# Patient Record
Sex: Male | Born: 1980 | Hispanic: Yes | Marital: Single | Smoking: Never smoker
Health system: Southern US, Community
[De-identification: ages and names within clinical notes are randomized; demographics above are authoritative.]

## PROBLEM LIST (undated history)

## (undated) DIAGNOSIS — R569 Unspecified convulsions: Secondary | ICD-10-CM

## (undated) HISTORY — PX: OTHER SURGICAL HISTORY: SHX169

---

## 2018-12-18 ENCOUNTER — Other Ambulatory Visit: Payer: Self-pay

## 2018-12-18 ENCOUNTER — Inpatient Hospital Stay (HOSPITAL_COMMUNITY)
Admission: EM | Admit: 2018-12-18 | Discharge: 2018-12-29 | DRG: 897 | Disposition: A | Discharge status: Home / Self Care | Payer: Self-pay | Attending: Internal Medicine | Admitting: Internal Medicine

## 2018-12-18 ENCOUNTER — Encounter (HOSPITAL_COMMUNITY): Payer: Self-pay | Admitting: *Deleted

## 2018-12-18 ENCOUNTER — Emergency Department (HOSPITAL_COMMUNITY): Payer: Self-pay

## 2018-12-18 ENCOUNTER — Inpatient Hospital Stay (HOSPITAL_COMMUNITY): Payer: Self-pay

## 2018-12-18 DIAGNOSIS — G4700 Insomnia, unspecified: Secondary | ICD-10-CM | POA: Diagnosis present

## 2018-12-18 DIAGNOSIS — G4089 Other seizures: Secondary | ICD-10-CM | POA: Diagnosis present

## 2018-12-18 DIAGNOSIS — R569 Unspecified convulsions: Secondary | ICD-10-CM

## 2018-12-18 DIAGNOSIS — X58XXXA Exposure to other specified factors, initial encounter: Secondary | ICD-10-CM | POA: Diagnosis present

## 2018-12-18 DIAGNOSIS — F10931 Alcohol use, unspecified with withdrawal delirium: Secondary | ICD-10-CM | POA: Diagnosis present

## 2018-12-18 DIAGNOSIS — Z1159 Encounter for screening for other viral diseases: Secondary | ICD-10-CM

## 2018-12-18 DIAGNOSIS — K0889 Other specified disorders of teeth and supporting structures: Secondary | ICD-10-CM

## 2018-12-18 DIAGNOSIS — D6959 Other secondary thrombocytopenia: Secondary | ICD-10-CM | POA: Diagnosis present

## 2018-12-18 DIAGNOSIS — G9340 Encephalopathy, unspecified: Secondary | ICD-10-CM | POA: Diagnosis present

## 2018-12-18 DIAGNOSIS — R740 Nonspecific elevation of levels of transaminase and lactic acid dehydrogenase [LDH]: Secondary | ICD-10-CM

## 2018-12-18 DIAGNOSIS — D696 Thrombocytopenia, unspecified: Secondary | ICD-10-CM | POA: Diagnosis present

## 2018-12-18 DIAGNOSIS — F10231 Alcohol dependence with withdrawal delirium: Principal | ICD-10-CM | POA: Diagnosis present

## 2018-12-18 DIAGNOSIS — E876 Hypokalemia: Secondary | ICD-10-CM | POA: Diagnosis present

## 2018-12-18 DIAGNOSIS — F1020 Alcohol dependence, uncomplicated: Secondary | ICD-10-CM

## 2018-12-18 DIAGNOSIS — K7010 Alcoholic hepatitis without ascites: Secondary | ICD-10-CM | POA: Diagnosis present

## 2018-12-18 DIAGNOSIS — S01512A Laceration without foreign body of oral cavity, initial encounter: Secondary | ICD-10-CM | POA: Diagnosis present

## 2018-12-18 DIAGNOSIS — K7030 Alcoholic cirrhosis of liver without ascites: Secondary | ICD-10-CM | POA: Diagnosis present

## 2018-12-18 DIAGNOSIS — E871 Hypo-osmolality and hyponatremia: Secondary | ICD-10-CM | POA: Diagnosis present

## 2018-12-18 DIAGNOSIS — F419 Anxiety disorder, unspecified: Secondary | ICD-10-CM | POA: Diagnosis present

## 2018-12-18 DIAGNOSIS — Z87898 Personal history of other specified conditions: Secondary | ICD-10-CM

## 2018-12-18 DIAGNOSIS — Z781 Physical restraint status: Secondary | ICD-10-CM

## 2018-12-18 DIAGNOSIS — F209 Schizophrenia, unspecified: Secondary | ICD-10-CM | POA: Diagnosis present

## 2018-12-18 HISTORY — DX: Unspecified convulsions: R56.9

## 2018-12-18 LAB — CBC WITH DIFFERENTIAL/PLATELET
Abs Immature Granulocytes: 0.08 10*3/uL — ABNORMAL HIGH (ref 0.00–0.07)
Basophils Absolute: 0.1 10*3/uL (ref 0.0–0.1)
Basophils Relative: 1 %
Eosinophils Absolute: 0.2 10*3/uL (ref 0.0–0.5)
Eosinophils Relative: 2 %
HCT: 34.4 % — ABNORMAL LOW (ref 39.0–52.0)
Hemoglobin: 12 g/dL — ABNORMAL LOW (ref 13.0–17.0)
Immature Granulocytes: 1 %
Lymphocytes Relative: 10 %
Lymphs Abs: 1 10*3/uL (ref 0.7–4.0)
MCH: 33.8 pg (ref 26.0–34.0)
MCHC: 34.9 g/dL (ref 30.0–36.0)
MCV: 96.9 fL (ref 80.0–100.0)
Monocytes Absolute: 0.8 10*3/uL (ref 0.1–1.0)
Monocytes Relative: 8 %
Neutro Abs: 7.7 10*3/uL (ref 1.7–7.7)
Neutrophils Relative %: 78 %
Platelets: 29 10*3/uL — CL (ref 150–400)
RBC: 3.55 MIL/uL — ABNORMAL LOW (ref 4.22–5.81)
RDW: 14.7 % (ref 11.5–15.5)
WBC: 9.9 10*3/uL (ref 4.0–10.5)
nRBC: 0 % (ref 0.0–0.2)

## 2018-12-18 LAB — MAGNESIUM: Magnesium: 1.6 mg/dL — ABNORMAL LOW (ref 1.7–2.4)

## 2018-12-18 LAB — COMPREHENSIVE METABOLIC PANEL
ALT: 46 U/L — ABNORMAL HIGH (ref 0–44)
AST: 176 U/L — ABNORMAL HIGH (ref 15–41)
Albumin: 3.1 g/dL — ABNORMAL LOW (ref 3.5–5.0)
Alkaline Phosphatase: 159 U/L — ABNORMAL HIGH (ref 38–126)
Anion gap: 15 (ref 5–15)
BUN: <5 mg/dL — ABNORMAL LOW (ref 6–20)
CO2: 22 mmol/L (ref 22–32)
Calcium: 8.6 mg/dL — ABNORMAL LOW (ref 8.9–10.3)
Chloride: 94 mmol/L — ABNORMAL LOW (ref 98–111)
Creatinine, Ser: 0.58 mg/dL — ABNORMAL LOW (ref 0.61–1.24)
GFR calc Af Amer: >60 mL/min (ref >60)
GFR calc non Af Amer: >60 mL/min (ref >60)
Glucose, Bld: 142 mg/dL — ABNORMAL HIGH (ref 70–99)
Potassium: 4.9 mmol/L (ref 3.5–5.1)
Sodium: 131 mmol/L — ABNORMAL LOW (ref 135–145)
Total Bilirubin: 3.1 mg/dL — ABNORMAL HIGH (ref 0.3–1.2)
Total Protein: 7.8 g/dL (ref 6.5–8.1)

## 2018-12-18 LAB — URINALYSIS, ROUTINE W REFLEX MICROSCOPIC
Bilirubin Urine: NEGATIVE
Glucose, UA: NEGATIVE mg/dL
Hgb urine dipstick: NEGATIVE
Ketones, ur: NEGATIVE mg/dL
Leukocytes,Ua: NEGATIVE
Nitrite: NEGATIVE
Protein, ur: NEGATIVE mg/dL
Specific Gravity, Urine: 1.009 (ref 1.005–1.030)
pH: 8 (ref 5.0–8.0)

## 2018-12-18 LAB — RAPID URINE DRUG SCREEN, HOSP PERFORMED
Amphetamines: NOT DETECTED
Barbiturates: NOT DETECTED
Benzodiazepines: NOT DETECTED
Cocaine: NOT DETECTED
Opiates: NOT DETECTED
Tetrahydrocannabinol: NOT DETECTED

## 2018-12-18 LAB — PROTIME-INR
INR: 1.3 — ABNORMAL HIGH (ref 0.8–1.2)
Prothrombin Time: 15.7 seconds — ABNORMAL HIGH (ref 11.4–15.2)

## 2018-12-18 LAB — SAVE SMEAR(SSMR), FOR PROVIDER SLIDE REVIEW

## 2018-12-18 LAB — ETHANOL: Alcohol, Ethyl (B): <10 mg/dL (ref <10)

## 2018-12-18 LAB — ACETAMINOPHEN LEVEL: Acetaminophen (Tylenol), Serum: <10 ug/mL — ABNORMAL LOW (ref 10–30)

## 2018-12-18 LAB — TSH: TSH: 2.033 u[IU]/mL (ref 0.350–4.500)

## 2018-12-18 LAB — PHOSPHORUS: Phosphorus: 2.6 mg/dL (ref 2.5–4.6)

## 2018-12-18 LAB — SARS CORONAVIRUS 2 BY RT PCR (HOSPITAL ORDER, PERFORMED IN ~~LOC~~ HOSPITAL LAB): SARS Coronavirus 2: NEGATIVE

## 2018-12-18 MED ORDER — LORAZEPAM 1 MG PO TABS
0.0000 mg | ORAL_TABLET | Freq: Four times a day (QID) | ORAL | Status: DC
  Administered 2018-12-19: 1 mg via ORAL
  Administered 2018-12-19: 2 mg via ORAL
  Filled 2018-12-18: qty 2
  Filled 2018-12-18: qty 1

## 2018-12-18 MED ORDER — FOLIC ACID 1 MG PO TABS
1.0000 mg | ORAL_TABLET | Freq: Every day | ORAL | Status: DC
  Administered 2018-12-18 – 2018-12-29 (×11): 1 mg via ORAL
  Filled 2018-12-18 (×12): qty 1

## 2018-12-18 MED ORDER — ONDANSETRON HCL 4 MG/2ML IJ SOLN: 4.0000 mg | Freq: Four times a day (QID) | INTRAMUSCULAR | Status: DC | PRN

## 2018-12-18 MED ORDER — LORAZEPAM 2 MG/ML IJ SOLN: 0.0000 mg | Freq: Two times a day (BID) | INTRAMUSCULAR | Status: DC

## 2018-12-18 MED ORDER — THIAMINE HCL 100 MG/ML IJ SOLN
100.0000 mg | Freq: Every day | INTRAMUSCULAR | Status: DC
  Administered 2018-12-21: 100 mg via INTRAVENOUS
  Filled 2018-12-18 (×2): qty 2

## 2018-12-18 MED ORDER — MAGNESIUM CHLORIDE 64 MG PO TBEC
2.0000 | DELAYED_RELEASE_TABLET | Freq: Once | ORAL | Status: AC
  Administered 2018-12-18: 128 mg via ORAL
  Filled 2018-12-18: qty 2

## 2018-12-18 MED ORDER — SODIUM CHLORIDE 0.9 % IV SOLN
INTRAVENOUS | Status: DC
  Administered 2018-12-18 – 2018-12-19 (×3): via INTRAVENOUS
  Administered 2018-12-20: 1000 mL via INTRAVENOUS
  Administered 2018-12-21 – 2018-12-25 (×3): via INTRAVENOUS

## 2018-12-18 MED ORDER — IBUPROFEN 400 MG PO TABS
400.0000 mg | ORAL_TABLET | Freq: Four times a day (QID) | ORAL | Status: DC | PRN
  Administered 2018-12-18 – 2018-12-22 (×2): 400 mg via ORAL
  Filled 2018-12-18 (×2): qty 1

## 2018-12-18 MED ORDER — LORAZEPAM 2 MG/ML IJ SOLN
0.0000 mg | Freq: Four times a day (QID) | INTRAMUSCULAR | Status: DC
  Administered 2018-12-18: 2 mg via INTRAVENOUS
  Administered 2018-12-18: 1 mg via INTRAVENOUS
  Administered 2018-12-18 – 2018-12-19 (×3): 2 mg via INTRAVENOUS
  Filled 2018-12-18 (×5): qty 1

## 2018-12-18 MED ORDER — THIAMINE HCL 100 MG/ML IJ SOLN
Freq: Once | INTRAVENOUS | Status: AC
  Administered 2018-12-18: 12:00:00 via INTRAVENOUS
  Filled 2018-12-18: qty 1000

## 2018-12-18 MED ORDER — ACETAMINOPHEN 650 MG RE SUPP
975.0000 mg | Freq: Once | RECTAL | Status: AC
  Administered 2018-12-18: 975 mg via RECTAL
  Filled 2018-12-18: qty 1

## 2018-12-18 MED ORDER — ONDANSETRON HCL 4 MG PO TABS: 4.0000 mg | ORAL_TABLET | Freq: Four times a day (QID) | ORAL | Status: DC | PRN

## 2018-12-18 MED ORDER — LORAZEPAM 2 MG/ML IJ SOLN
1.0000 mg | Freq: Once | INTRAMUSCULAR | Status: AC
  Administered 2018-12-18: 1 mg via INTRAVENOUS
  Filled 2018-12-18: qty 1

## 2018-12-18 MED ORDER — VITAMIN B-1 100 MG PO TABS
100.0000 mg | ORAL_TABLET | Freq: Every day | ORAL | Status: DC
  Administered 2018-12-18 – 2018-12-23 (×4): 100 mg via ORAL
  Filled 2018-12-18 (×5): qty 1

## 2018-12-18 MED ORDER — LORAZEPAM 1 MG PO TABS: 0.0000 mg | ORAL_TABLET | Freq: Two times a day (BID) | ORAL | Status: DC

## 2018-12-18 MED ORDER — ADULT MULTIVITAMIN W/MINERALS CH
1.0000 | ORAL_TABLET | Freq: Every day | ORAL | Status: DC
  Administered 2018-12-18 – 2018-12-29 (×11): 1 via ORAL
  Filled 2018-12-18 (×12): qty 1

## 2018-12-18 NOTE — Progress Notes (Deleted)
Patient's mother updated on plan of care; questions answered. Will continue to monitor.  Hiram Comber, RN 12/18/2018 4:58 PM

## 2018-12-18 NOTE — ED Notes (Signed)
Platelets 29- critical reported from lab

## 2018-12-18 NOTE — Progress Notes (Signed)
Offered to call patient's family to inform them of hospitalization but patient declined. Communicated with patient via [place] interpreter. Denies any pain or SOB. Educated patient about use of call bell and bed alarm. Patient's questions answered to satisfaction. Will continue to monitor.   Hiram Comber, RN 12/18/2018 6:04 PM

## 2018-12-18 NOTE — ED Notes (Signed)
Patient is resting comfortably. 

## 2018-12-18 NOTE — H&P (Signed)
Date: 12/18/2018               Patient Name:  James Huff MRN: [id number]  DOB: 1980-12-09 Age / Sex: 38 y.o., male   PCP: Patient, No Pcp Per         Medical Service: Internal Medicine Teaching Service         Attending Physician: Dr. Sid Falcon, MD    First Contact: Dr. Earlene Plater  Pager: [phone]  Second Contact: Dr. Pearson Grippe Pager: [phone]       After Hours (After 5p/  First Contact Pager: [phone]  weekends / holidays): Second Contact Pager: [phone]   Chief Complaint: Seizures  History of Present Illness: History of obtained from the [place] translator. Of note, the patient is confused but is able to assist in giving a history. Some of the pertinent history points are limited by the patient's confusion and lack of witnesses present at the bedside.  The patient says he was traveling from [place] to [place] on a bus when he had a seizure. The last thing he remembers is losing consciousness. The seizure lasted roughly 2 minutes and was "full body" per witnesses. When the patient woke up he felt confused, sleepy, and noticed that he used the bathroom on himself. He also bit the right side of his tongue and was complaining of a headache.The episode was witnessed by several bus passengers, but none were at the beside when we talked to the patient. Of note, the patient says this has happened before and he had to go to the hospital 3 years ago. He does report drinking 10-15 beers a day. The last drink he had was roughly 2 days ago. He does not take any seizure medications currently, and has not in the past. He reports that he has no medical problems.  In the ED, the CMP was notable for hyponatremia (131), transaminitis (AST 176, ALT 46), an elevated alk phos at 159, and T bili of 3.1. PT was elevated at 15.7 and INR 1.3. CT of the head was performed and there were no significant findings. The patient received a total of Ativan 3 mg with resolution of symptoms. IV fluids  and thiamine were also started. Vital signs have been stable.   Meds: Patient does not take any home medications No outpatient medications have been marked as taking for the 12/18/18 encounter [facility] Encounter).    Allergies: Allergies as of 12/18/2018  . (No Known Allergies)   Past Medical History:  Diagnosis Date  . Seizure [facility])     Surgical History: Past Surgical History:  Procedure Laterality Date  . finger tip injury repair Left    3rd finger    Family History:  Family History  Problem Relation Age of Onset  . Asthma Mother   . Asthma Brother     Social History:  Social History   Tobacco Use  . Smoking status: Never Smoker  . Smokeless tobacco: Never Used  Substance Use Topics  . Alcohol use: Yes    Alcohol/week: 70.0 standard drinks    Types: 70 Cans of beer per week    Comment: 10-15 beer/day  . Drug use: Never    Review of Systems: A complete ROS was negative except as per HPI.  Physical Exam: Blood pressure 117/74, pulse 75, temperature 99.3 F (37.4 C), temperature source Oral, resp. rate 15, height 6' (1.829 m), weight 70.3 kg, SpO2 97 %. Physical Exam  Constitutional: He appears well-developed.  HENT:  Head: Normocephalic and atraumatic.  Tongue laceration on the R side of the tongue Poor dentition   Eyes: Pupils are equal, round, and reactive to light. Right eye exhibits no discharge. Left eye exhibits no discharge. No scleral icterus.  Nystagmus noted with bilateral lateral gaze.    Cardiovascular: Normal rate, regular rhythm and normal heart sounds. Exam reveals no gallop and no friction rub.  No murmur heard. Pulmonary/Chest: Effort normal and breath sounds normal. No respiratory distress. He has no wheezes. He has no rales.  Abdominal: He exhibits no distension and no mass. There is no abdominal tenderness. There is no rebound and no guarding.  No hepatosplenomegaly appreciated with deep palpation   Musculoskeletal: Normal range of  motion.     Comments: Bilateral UE 5/5 strength Bilateral LE 5/5 strength    Neurological: He is alert. He is disoriented. He displays tremor. He displays no atrophy. No cranial nerve deficit or sensory deficit. He displays no seizure activity. Coordination abnormal.  Alert and oriented x1. Patient correctly states he is in the hospital, but thought he was in [place] and year was 2018. Does recite name correctly. Finger to nose test slowed and tremulous bilaterally. No asterixis noted.  Skin: Skin is warm. He is diaphoretic. No cyanosis. Nails show no clubbing.  Psychiatric: His mood appears anxious. His affect is not angry, not blunt and not inappropriate. His speech is delayed. His speech is not rapid and/or pressured, not tangential and not slurred. He is slowed. He is not agitated, not aggressive, not hyperactive, not withdrawn, not actively hallucinating and not combative. Cognition and memory are impaired. He does not express impulsivity or inappropriate judgment. He is attentive.   EKG: personally reviewed my interpretation is Normal sinus rhythm. No abnormalities appreciated -EKG Interpretation  Date/Time:                  Wednesday December 18 2018 08:31:40 EDT Ventricular Rate:         88 PR Interval:                   QRS Duration: 88 QT Interval:                 381 QTC Calculation:        461 R Axis:                         30 Text Interpretation:       Sinus   CT head:  Result Date: 12/18/2018 CLINICAL DATA:  Seizures EXAM: CT HEAD WITHOUT CONTRAST TECHNIQUE: Contiguous axial images were obtained from the base of the skull through the vertex without intravenous contrast. COMPARISON:  None. FINDINGS: Brain: The ventricles are normal in size and configuration. There is no intracranial mass, hemorrhage, extra-axial fluid collection, or midline shift. The brain parenchyma appears unremarkable. No acute infarct is demonstrable. Vascular: There is no hyperdense vessel. There is no  appreciable vascular calcification. Skull: Bony calvarium appears intact. Sinuses/Orbits: Visualized paranasal sinuses are clear. Orbits appear symmetric bilaterally. Other: Mastoid air cells are clear. IMPRESSION: Study within normal limits. Electronically Signed   By: Lowella Grip III M.D.   On: 12/18/2018 09:16   Assessment & Plan by Problem: Active Problems:   Seizure ([place])  In summary, Mr. Dowty is a 38 y.o male with a history of alcohol withdrawal related seizures who presented today after a syncopal event in the context of significantly elevated LFTs, thrombocytopenia,  loss of bowel and bladder function, tongue laceration, tremors, and confusion, and improvement of symptoms after Ativan administration.These findings are very suggestive of alcohol withdrawal seizures.  #Alcohol Withdrawl: Given the patients previous history of heavy drinking, elevated LFTs in addition to thrombocytopenia, tremors,diminished coordination, and tongue laceration on physical exam these findings are highly suggestive of seizure due to alcohol withdrawal. The patient noted his last drink was roughly 2 days ago, which is within the expected window of starting to have withdrawal symptoms. After receiving Ativan in the ED, his symptoms have greatly improved. - Continue IVF - Continue IV thiamine   #Seizure: Witnesses on the bus reported to EMS that the seizures were "full body" jerks. The tongue laceration and loss of bowel and bladder is consistent with a seizure.  - PRN Ativan for seizures  - Continue IV thiamine   #Transaminitis: AST 176 and ALT 46 upon admission, consistent with excessive alcohol use. However, viral hepatitis should be investigated and ruled out.   - AST 176, ALT 46 upon admission. Will continue to monitor and trend - Hepatitis A total antibody ordered, Hepatitis B core, surface and antigen panel ordered, will follow up on these labs  #Thrombocytopenia: Platelet count 29 on admission,  likely due to alcohol abuse - Daily CBCs to monitor Hgb & platelet count - Will consider platelet transfusion if <10  #Hyponatremia - Monitor with daily CMPs - Continue IVF   #Hypomangnesemia -Repleted on 7/1, will monitor on daily CMP  Dispo: Admit patient to Observation with expected length of stay less than 2 midnights.  Signed: Earlene Plater, MD 12/18/2018, 2:38 PM  Pager: [phone]

## 2018-12-18 NOTE — ED Notes (Addendum)
I entered room and found the pt standing up unsteadily in a puddle of urine, still wearing his pants and trying to use the urinal.  Pt voided approx 525 into urinal. Pt was assisted back to bed and undressed further. Urine collected and sent to lab. Culture collected and sent to lab if needd. Floor cleaned.  Clothes bagged.

## 2018-12-18 NOTE — Progress Notes (Signed)
James Huff [id number] Admission Data: 12/18/2018 4:19 PM Attending Provider: Sid Falcon, MD  ESP:[id number], No Pcp Per Consults/ Treatment Team:   James Huff is a 38 y.o. male patient admitted from ED awake, alert  & orientated  X 3,  Full Code, VSS - Blood pressure 115/85, pulse 72, temperature 98.8 F (37.1 C), temperature source Oral, resp. rate 16, height 6' (1.829 m), weight 70.3 kg, SpO2 99 %., on RA, no c/o shortness of breath, no c/o chest pain, no distress noted. Tele # [id number] placed and pt is currently running:normal sinus rhythm.   Allergies:  No Known Allergies   Past Medical History:  Diagnosis Date  . Seizure ([place])     Pt orientation to unit, room and routine. Information packet given to patient/family and safety video watched.  Admission INP armband ID verified with patient/family, and in place. SR up x 2, fall risk assessment complete with Patient and family verbalizing understanding of risks associated with falls. Pt verbalizes an understanding of how to use the call bell and to call for help before getting out of bed.  Skin, clean-dry- intact without evidence of bruising, or skin tears.     Will cont to monitor and assist as needed.  Hiram Comber, RN 12/18/2018 4:19 PM

## 2018-12-18 NOTE — ED Notes (Signed)
Attempted report 

## 2018-12-18 NOTE — ED Triage Notes (Addendum)
Pt here via [facility] after being picked up at a gas station for after having a seizure, while in transit from [place].  Seizure lasted 2 min and was "full body" per witnesses.  Pt does not know month or year.  Denies med hx.  States drink 2 - 6 beers per day and last drank yesterday am.  C/o headache.

## 2018-12-18 NOTE — ED Provider Notes (Signed)
[facility] Provider Note   CSN: [id number] Arrival date & time: 12/18/18  0830    History   Chief Complaint Chief Complaint  Patient presents with  . Seizures    HPI James Huff is a 38 y.o. male.     HPI  History is obtained in combination through the patient directly and through [place] translator.  Of note, patient does seem mildly confused but is assisting in history.  Patient was on a bus from [place].  He reports that he was going to [place].  [place] stop and patient was witnessed to fall down and have a seizure.  No bystanders or companions are present with the patient.  Reportedly he was traveling alone.  The patient reports that he fainted and then had a seizure.  He reports he had a bad headache after the seizure.  Patient reports he has had seizures before (it is unclear if this is accurate).  He reports he has had seizures because his job is so dangerous.  He reports that he works in a [employer].  Patient does endorse that he drinks alcohol but is only reporting a few beers per day.  He is denying any other drug use.  He has denied that he was ever told he should be on a seizure medication.  He denies that he was sick prior to leaving [place].  He however later endorsed to nursing staff that he was having problems with diarrhea. History reviewed. No pertinent past medical history.  There are no active problems to display for this patient.   History reviewed. No pertinent surgical history.      Home Medications    Prior to Admission medications   Not on File    Family History No family history on file.  Social History Social History   Tobacco Use  . Smoking status: Never Smoker  . Smokeless tobacco: Never Used  Substance Use Topics  . Alcohol use: Yes    Alcohol/week: 46.0 standard drinks    Types: 46 Cans of beer per week  . Drug use: Never     Allergies   Patient has no known allergies.   Review of Systems Review of Systems 10 Systems reviewed and are negative for acute change except as noted in the HPI.   Physical Exam Updated Vital Signs BP 121/77   Pulse 87   Temp 99.3 F (37.4 C) (Oral)   Resp (!) 32   Ht 6' (1.829 m)   Wt 70.3 kg   SpO2 98%   BMI 21.02 kg/m   Physical Exam Constitutional:      Comments: Patient is awake and alert.  He does seem slightly agitated and mildly confused.  No respiratory distress.  HENT:     Head: Normocephalic and atraumatic.     Nose: Nose normal.     Mouth/Throat:     Mouth: Mucous membranes are moist.     Pharynx: Oropharynx is clear.     Comments: Patient does have a tongue laceration to the right side of the tongue.  There is some fresh blood in the mouth but no active bleeding. Eyes:     Extraocular Movements: Extraocular movements intact.     Pupils: Pupils are equal, round, and reactive to light.  Neck:     Musculoskeletal: Neck supple.  Cardiovascular:     Rate and Rhythm: Normal rate and regular rhythm.  Pulmonary:     Effort: Pulmonary effort is normal.  Breath sounds: Normal breath sounds.  Abdominal:     General: There is no distension.     Palpations: Abdomen is soft.     Tenderness: There is no abdominal tenderness. There is no guarding.  Musculoskeletal: Normal range of motion.        General: No swelling or deformity.  Skin:    General: Skin is warm and dry.  Neurological:     Comments: Patient is awake and alert.  He seems mildly confused.  He does answer many questions appropriately but history sometimes seems vague.  He is very tremulous.  If he moves his hands at all intentionally patient has significant tremor.  No focal motor deficits.  He can follow commands appropriately.      ED Treatments / Results  Labs (all labs ordered are listed, but only abnormal results are displayed) Labs Reviewed  COMPREHENSIVE METABOLIC PANEL - Abnormal; Notable for the following components:      Result  Value   Sodium 131 (*)    Chloride 94 (*)    Glucose, Bld 142 (*)    BUN <5 (*)    Creatinine, Ser 0.58 (*)    Calcium 8.6 (*)    Albumin 3.1 (*)    AST 176 (*)    ALT 46 (*)    Alkaline Phosphatase 159 (*)    Total Bilirubin 3.1 (*)    All other components within normal limits  ACETAMINOPHEN LEVEL - Abnormal; Notable for the following components:   Acetaminophen (Tylenol), Serum <10 (*)    All other components within normal limits  CBC WITH DIFFERENTIAL/PLATELET - Abnormal; Notable for the following components:   RBC 3.55 (*)    Hemoglobin 12.0 (*)    HCT 34.4 (*)    Platelets 29 (*)    Abs Immature Granulocytes 0.08 (*)    All other components within normal limits  PROTIME-INR - Abnormal; Notable for the following components:   Prothrombin Time 15.7 (*)    INR 1.3 (*)    All other components within normal limits  MAGNESIUM - Abnormal; Notable for the following components:   Magnesium 1.6 (*)    All other components within normal limits  SARS CORONAVIRUS 2 (HOSPITAL ORDER, PERFORMED IN [facility])  ETHANOL  PHOSPHORUS  TSH  URINALYSIS, ROUTINE W REFLEX MICROSCOPIC  RAPID URINE DRUG SCREEN, HOSP PERFORMED    EKG EKG Interpretation  Date/Time:  Wednesday December 18 2018 08:31:40 EDT Ventricular Rate:  88 PR Interval:    QRS Duration: 88 QT Interval:  381 QTC Calculation: 461 R Axis:   30 Text Interpretation:  Sinus rhythm normal Confirmed by Arby BarrettePfeiffer, Venson Ferencz [phone]([id number]) on 12/18/2018 10:17:24 AM   Radiology Ct Head Wo Contrast  Result Date: 12/18/2018 CLINICAL DATA:  Seizures EXAM: CT HEAD WITHOUT CONTRAST TECHNIQUE: Contiguous axial images were obtained from the base of the skull through the vertex without intravenous contrast. COMPARISON:  None. FINDINGS: Brain: The ventricles are normal in size and configuration. There is no intracranial mass, hemorrhage, extra-axial fluid collection, or midline shift. The brain parenchyma appears unremarkable. No acute  infarct is demonstrable. Vascular: There is no hyperdense vessel. There is no appreciable vascular calcification. Skull: Bony calvarium appears intact. Sinuses/Orbits: Visualized paranasal sinuses are clear. Orbits appear symmetric bilaterally. Other: Mastoid air cells are clear. IMPRESSION: Study within normal limits. Electronically Signed   By: Bretta BangWilliam  Woodruff III M.D.   On: 12/18/2018 09:16    Procedures Procedures (including critical care time) CRITICAL CARE Performed  by: Charlesetta Shanks   Total critical care time: 20 minutes  Critical care time was exclusive of separately billable procedures and treating other patients.  Critical care was necessary to treat or prevent imminent or life-threatening deterioration.  Critical care was time spent personally by me on the following activities: development of treatment plan with patient and/or surrogate as well as nursing, discussions with consultants, evaluation of patient's response to treatment, examination of patient, obtaining history from patient or surrogate, ordering and performing treatments and interventions, ordering and review of laboratory studies, ordering and review of radiographic studies, pulse oximetry and re-evaluation of patient's condition. Medications Ordered in ED Medications  LORazepam (ATIVAN) injection 0-4 mg (2 mg Intravenous Given 12/18/18 1142)    Or  LORazepam (ATIVAN) tablet 0-4 mg ( Oral See Alternative 12/18/18 1142)  LORazepam (ATIVAN) injection 0-4 mg (has no administration in time range)    Or  LORazepam (ATIVAN) tablet 0-4 mg (has no administration in time range)  thiamine (VITAMIN B-1) tablet 100 mg (100 mg Oral Given 12/18/18 1146)    Or  thiamine (B-1) injection 100 mg ( Intravenous See Alternative 12/18/18 1146)  LORazepam (ATIVAN) injection 1 mg (1 mg Intravenous Given 12/18/18 0921)  sodium chloride 0.9 % 1,000 mL with thiamine 161 mg, folic acid 1 mg, multivitamins adult 10 mL infusion ( Intravenous New  Bag/Given 12/18/18 1142)  acetaminophen (TYLENOL) suppository 975 mg (975 mg Rectal Given 12/18/18 0919)     Initial Impression / Assessment and Plan / ED Course  I have reviewed the triage vital signs and the nursing notes.  Pertinent labs & imaging results that were available during my care of the patient were reviewed by me and considered in my medical decision making (see chart for details).       Patient presents as outlined above.  Findings are highly suggestive of alcohol withdrawal seizures.  Patient has significantly elevated LFTs along with thrombocytopenia.  Alcohol is negative.  Patient has tremors and seems mildly confused suggestive of alcohol withdrawal.  He has been given several doses of Ativan.  Patient remains awake and alert.  CT head is negative.  He is neurologically intact.  Plan to admit for alcohol withdrawal with delirium tremens and seizure  Final Clinical Impressions(s) / ED Diagnoses   Final diagnoses:  Seizure ([place])  Alcohol withdrawal syndrome, with delirium [facility]    ED Discharge Orders    None       Charlesetta Shanks, MD 12/18/18 1201

## 2018-12-19 DIAGNOSIS — R451 Restlessness and agitation: Secondary | ICD-10-CM

## 2018-12-19 LAB — COMPREHENSIVE METABOLIC PANEL
ALT: 30 U/L (ref 0–44)
AST: 102 U/L — ABNORMAL HIGH (ref 15–41)
Albumin: 2.7 g/dL — ABNORMAL LOW (ref 3.5–5.0)
Alkaline Phosphatase: 150 U/L — ABNORMAL HIGH (ref 38–126)
Anion gap: 9 (ref 5–15)
BUN: <5 mg/dL — ABNORMAL LOW (ref 6–20)
CO2: 26 mmol/L (ref 22–32)
Calcium: 8.4 mg/dL — ABNORMAL LOW (ref 8.9–10.3)
Chloride: 102 mmol/L (ref 98–111)
Creatinine, Ser: 0.49 mg/dL — ABNORMAL LOW (ref 0.61–1.24)
GFR calc Af Amer: >60 mL/min (ref >60)
GFR calc non Af Amer: >60 mL/min (ref >60)
Glucose, Bld: 87 mg/dL (ref 70–99)
Potassium: 3.3 mmol/L — ABNORMAL LOW (ref 3.5–5.1)
Sodium: 137 mmol/L (ref 135–145)
Total Bilirubin: 3.1 mg/dL — ABNORMAL HIGH (ref 0.3–1.2)
Total Protein: 7.1 g/dL (ref 6.5–8.1)

## 2018-12-19 LAB — CBC
HCT: 32.1 % — ABNORMAL LOW (ref 39.0–52.0)
Hemoglobin: 11.1 g/dL — ABNORMAL LOW (ref 13.0–17.0)
MCH: 33.5 pg (ref 26.0–34.0)
MCHC: 34.6 g/dL (ref 30.0–36.0)
MCV: 97 fL (ref 80.0–100.0)
Platelets: 33 10*3/uL — ABNORMAL LOW (ref 150–400)
RBC: 3.31 MIL/uL — ABNORMAL LOW (ref 4.22–5.81)
RDW: 14.4 % (ref 11.5–15.5)
WBC: 6.8 10*3/uL (ref 4.0–10.5)
nRBC: 0 % (ref 0.0–0.2)

## 2018-12-19 LAB — GLUCOSE, CAPILLARY: Glucose-Capillary: 90 mg/dL (ref 70–99)

## 2018-12-19 MED ORDER — LORAZEPAM 2 MG/ML IJ SOLN
2.0000 mg | INTRAMUSCULAR | Status: DC | PRN
  Administered 2018-12-20: 2 mg via INTRAVENOUS
  Filled 2018-12-19: qty 1

## 2018-12-19 MED ORDER — HALOPERIDOL LACTATE 5 MG/ML IJ SOLN
1.0000 mg | Freq: Three times a day (TID) | INTRAMUSCULAR | Status: DC | PRN
  Administered 2018-12-19 – 2018-12-20 (×2): 1 mg via INTRAMUSCULAR
  Filled 2018-12-19 (×2): qty 1

## 2018-12-19 MED ORDER — CHLORDIAZEPOXIDE HCL 25 MG PO CAPS
25.0000 mg | ORAL_CAPSULE | Freq: Three times a day (TID) | ORAL | Status: AC
  Administered 2018-12-20 – 2018-12-21 (×3): 25 mg via ORAL
  Filled 2018-12-19 (×3): qty 1

## 2018-12-19 MED ORDER — ONDANSETRON 4 MG PO TBDP
4.0000 mg | ORAL_TABLET | Freq: Four times a day (QID) | ORAL | Status: AC | PRN
  Filled 2018-12-19: qty 1

## 2018-12-19 MED ORDER — LOPERAMIDE HCL 2 MG PO CAPS: 2.0000 mg | ORAL_CAPSULE | ORAL | Status: AC | PRN

## 2018-12-19 MED ORDER — LORAZEPAM 2 MG/ML IJ SOLN
1.0000 mg | INTRAMUSCULAR | Status: DC | PRN
  Administered 2018-12-19: 1 mg via INTRAVENOUS
  Filled 2018-12-19: qty 1

## 2018-12-19 MED ORDER — CHLORDIAZEPOXIDE HCL 25 MG PO CAPS
25.0000 mg | ORAL_CAPSULE | Freq: Four times a day (QID) | ORAL | Status: AC
  Administered 2018-12-19 (×2): 25 mg via ORAL
  Filled 2018-12-19 (×4): qty 1

## 2018-12-19 MED ORDER — HALOPERIDOL 1 MG PO TABS
1.0000 mg | ORAL_TABLET | Freq: Three times a day (TID) | ORAL | Status: DC | PRN
  Filled 2018-12-19: qty 1

## 2018-12-19 MED ORDER — LORAZEPAM 1 MG PO TABS: 2.0000 mg | ORAL_TABLET | ORAL | Status: DC | PRN

## 2018-12-19 MED ORDER — CHLORDIAZEPOXIDE HCL 25 MG PO CAPS
25.0000 mg | ORAL_CAPSULE | Freq: Every day | ORAL | Status: AC
  Administered 2018-12-22: 25 mg via ORAL
  Filled 2018-12-19: qty 1

## 2018-12-19 MED ORDER — CHLORDIAZEPOXIDE HCL 25 MG PO CAPS
25.0000 mg | ORAL_CAPSULE | ORAL | Status: AC
  Administered 2018-12-21 – 2018-12-22 (×2): 25 mg via ORAL
  Filled 2018-12-19 (×2): qty 1

## 2018-12-19 MED ORDER — CHLORDIAZEPOXIDE HCL 25 MG PO CAPS
50.0000 mg | ORAL_CAPSULE | Freq: Once | ORAL | Status: AC
  Administered 2018-12-19: 50 mg via ORAL
  Filled 2018-12-19: qty 2

## 2018-12-19 MED ORDER — LORAZEPAM 1 MG PO TABS: 1.0000 mg | ORAL_TABLET | ORAL | Status: DC | PRN

## 2018-12-19 MED ORDER — HALOPERIDOL LACTATE 5 MG/ML IJ SOLN
2.0000 mg | Freq: Once | INTRAMUSCULAR | Status: AC
  Administered 2018-12-19: 2 mg via INTRAVENOUS
  Filled 2018-12-19: qty 1

## 2018-12-19 NOTE — Progress Notes (Addendum)
Subjective: Telephone interpretor present during visit. Reports feeling much better. He seems to be able to recall some events leading up to admission, but there still seems to be some confabulation. The patient states that he was kicked out of his home by his girlfriend in [place] and called friends for a place to stay. He was traveling on a bus, but unable to communicate where his final destination was supposed to be. When I asked where he would go after he leaves the hospital, his answer was "I don't know". He is still disoriented.   Objective:  Vital signs in last 24 hours: Vitals:   12/18/18 1545 12/18/18 1604 12/18/18 2235 12/19/18 0504  BP: 115/83 115/85 119/88 (!) 116/95  Pulse: 73 72 82 77  Resp: 16 18 (!) 22 16  Temp:  98.8 F (37.1 C) 98.4 F (36.9 C) 98 F (36.7 C)  TempSrc:  Oral    SpO2: 98% 99% 99% 99%  Weight:      Height:       Physical Exam Constitutional:      General: He is not in acute distress.    Appearance: He is normal weight.  HENT:     Head: Normocephalic and atraumatic.  Cardiovascular:     Rate and Rhythm: Normal rate.     Pulses: Normal pulses.     Heart sounds: Normal heart sounds. No murmur. No friction rub. No gallop.   Pulmonary:     Effort: Pulmonary effort is normal. No respiratory distress.     Breath sounds: Normal breath sounds. No wheezing or rales.  Abdominal:     General: Abdomen is flat. Bowel sounds are normal. There is no distension.     Palpations: Abdomen is soft.     Tenderness: There is no abdominal tenderness.  Skin:    General: Skin is warm.  Neurological:     General: No focal deficit present.     Mental Status: He is alert.     Comments: Alert and oriented x 1 to person. When asked where he is, he responded [place] (I suspect he means [place] because yesterday he said he was in [place] When asked the year he says 2003.    Psychiatric:        Mood and Affect: Mood normal.     Assessment/Plan:   Active Problems:   Seizure (HCC)   Alcohol withdrawal syndrome, with delirium (HCC)  Seizure (HCC)  In summary, [name] is a 38 y.o male with a history of alcohol withdrawal related seizures who presented on 7/1 after a syncopal event in the context of significantly elevated LFTs, thrombocytopenia, loss of bowel and bladder function, tongue laceration, tremors, and confusion, and improvement of symptoms after Ativan administration.These findings are very suggestive of alcohol withdrawal seizures.  #Alcohol Withdrawl: Given the patients previous history of heavy drinking, elevated LFTs in addition to thrombocytopenia, tremors,diminished coordination, and tongue laceration on physical exam these findings are highly suggestive of seizure due to alcohol withdrawal. The patient noted his last drink was roughly 2 days before admission, which is within the expected window of starting to have withdrawal symptoms. Today (7/2) the patient still seems to be confused about where he is or what year it is. 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 Bartles trying to leave the unit, despite having talked to him about  why it is important to stay to ensure his safety. It appears his AMS is clouding his judgement, and IVC is necessary today (7/2)   #Seizure: Witnesses on the bus reported to EMS that the seizures were "full body" jerks. The tongue laceration and loss of bowel and bladder is consistent with a seizure.  - PRN Ativan for seizures  - Continue IV thiamine   #Transaminitis: AST 176 and ALT 46 upon admission, consistent with excessive alcohol use. However, viral hepatitis should be investigated and ruled out.   - AST 176, ALT 46 upon admission. Trending down AST 102 ALT 30 today. - Hepatitis A total antibody ordered, Hepatitis B core, surface and antigen panel ordered,  will follow up on these labs  #Thrombocytopenia: Platelet count 29 on admission, likely due to alcohol abuse. Platelet count 33 today - Daily CBCs to monitor Hgb & platelet count - Will consider platelet transfusion if <10  #Hyponatremia RESOLVED - 131 on admission, 137 today - Monitor with daily CMPs - Continue IVF   #Hypomangnesemia -Repleted on 7/1  Dispo: Admit patient to Observation with expected length of stay less than 2 midnights.   Earlene Plater, MD 12/19/2018, 10:02 AM Pager: [phone]

## 2018-12-19 NOTE — Progress Notes (Addendum)
Patient continue to be agitated, trying to leave through the emergency exit door; uncooperative with staff; pulled of a second IV. Staff called security to help him back in bed. MD made aware and per order patient was placed on soft non-violent waist restraints and mitts, and Haldol 1 mg IM was administered per PRN order. Will continue to monitor.

## 2018-12-19 NOTE — Progress Notes (Signed)
Patient is refusing to keep tele monitor on. Patient was educated via [place] interpreter about the importance of monitoring his heart activity. MD made aware. Will continue to monitor.

## 2018-12-19 NOTE — Progress Notes (Addendum)
Paged by RN about agitation and patient being aggressive with staff. Received 2 mg of Ativan and 1 mg Haldol with no improvement. Went to evaluate patient at bedside.   Patient was in bed in soft restraints attempting to remove mittens. He stated he was ready to fight and that he needed to go home. He is alert to self only. I explained to him he is in the hospital and needs to stay for treatment of alcohol withdrawal. He did not understand and continue to state he wanted to go home and threatening to fight the staff. He was also attempting to remove the mittens with his mouth. He is only redirectable for a few seconds and tries to get out of bed as soon as staff steps out of the room. Sitter present in room who is having a difficult time redirecting patient as well.   I ordered 2 mg of IV Haldol and recommended starting Librium taper if he remains agitated. May need to transfer to progressive for stepdown librium taper and stepdown CIWA protocol.   Welford Roche, MD  Internal Medicine PGY-2  P [phone]

## 2018-12-19 NOTE — Progress Notes (Signed)
CSW received consult regarding need for IVC due to patient's confusion and attempts to leave. CSW working on [employer] paperwork.  Percell Locus Rose Hippler LCSW [phone]

## 2018-12-19 NOTE — Progress Notes (Signed)
No contact numbers on out contact information list; writer offered to call patient's family to inform them about his status, but he declined. Will continue to monitor.

## 2018-12-19 NOTE — Progress Notes (Signed)
IVC has been accepted and signed by the [employer]. Papers on shadow chart for patient to be served.   Percell Locus Shynia Daleo LCSW [phone]

## 2018-12-19 NOTE — Progress Notes (Signed)
Security was called for patient trying to leave, however we were able to get a [employer] that is fluent in [place] to talk to him. He was able to calm him down and patient agreed to let us put another IV in. IVC paperwork is in progress. FU CIWA was completed on patient and he was given 2mg  IV Ativan. Awaiting sitter to come to sit with him. Will continue to follow as needed. Cathlean Marseilles Tamala Julian, MSN, RN, [place], AGCNS

## 2018-12-19 NOTE — Progress Notes (Signed)
Spoke with nurse regarding multiple PIV today. She states "patient has been restless. Medications have been given." Patient has mitts on, soft restraints, sitter at bedside. PIV wrapped with gauze after placement. Fran Lowes, RN [facility]

## 2018-12-19 NOTE — Progress Notes (Addendum)
  Patient became more agitated, trying to leave the hospital. He pulled off his IV and tele monitor. Writer communicated with patient via [place] interpreter. Writer Per CIWA (score 9) patient received Ativan 1 mg po (IV not available at this time). MD was notified. Will continue to monitor.  Cathlean Marseilles Tamala Julian, MSN, RN, [place], AGCNS

## 2018-12-19 NOTE — Progress Notes (Signed)
Paged for continued tachycardia and agitation.  Patient visited at bedside. He is tachycardic to 140's, diaphoretic, with a mild tremor in his feet and hands. He is not oriented to the severity of his illness and insists on going to his office ASAP. He is oriented to self only.  Per nursing staff, the patient received 75mg  librium around 9pm. Given the pharmacodynamics and pharmacokinetics of this medication he should be experiencing the peak onset up to within the next hour. A dose of ativan, 1mg  IV was given but they are uncertain if this was delivered as his IV is now no longer functional.   Vitals:   12/19/18 1649 12/19/18 2107  BP: (!) 134/105   Pulse: (!) 102 (!) 163  Resp: 20   Temp: 98.7 F (37.1 C)   SpO2: 98%     Plan: Continue librium, next dose at midnight Continue ativan IV, will increase to 2mg  Q4 hrs per CIWA scores as ordered Continue Telemetry and pulse oximetry monitoring Notify MD if patients status does not improve in the next 1-2 hours We will continue to monitor his closely

## 2018-12-19 NOTE — Progress Notes (Signed)
The patient's nurse called and said the patient had become more agitated and wanted to leave the hospital. He was given 1 mg PO Ativan Talked to the patient at the bedside for roughly 15 minutes through the interpreter on the phone. The patient still seemed to be confused and might have been confabulating. The patient stated that he wanted to leave the hospital now because he has sick family members in [place] he needs to return to. We explained to the patient that it wasn't safe to leave just yet, since since there are still labs and treatments we are still working with him. He acknowledged this, and said he would be willing to stay in the hospital.   Earlene Plater, MD

## 2018-12-19 NOTE — Progress Notes (Signed)
Patient continue to be agitated and uncooperative with staff; trying to take off his restraints. His HR is in 140s. MD made aware.Will continue to monitor.

## 2018-12-20 DIAGNOSIS — E876 Hypokalemia: Secondary | ICD-10-CM

## 2018-12-20 LAB — CBC
HCT: 32.9 % — ABNORMAL LOW (ref 39.0–52.0)
Hemoglobin: 11.6 g/dL — ABNORMAL LOW (ref 13.0–17.0)
MCH: 33.8 pg (ref 26.0–34.0)
MCHC: 35.3 g/dL (ref 30.0–36.0)
MCV: 95.9 fL (ref 80.0–100.0)
Platelets: 51 10*3/uL — ABNORMAL LOW (ref 150–400)
RBC: 3.43 MIL/uL — ABNORMAL LOW (ref 4.22–5.81)
RDW: 14.2 % (ref 11.5–15.5)
WBC: 11.2 10*3/uL — ABNORMAL HIGH (ref 4.0–10.5)
nRBC: 0 % (ref 0.0–0.2)

## 2018-12-20 LAB — COMPREHENSIVE METABOLIC PANEL
ALT: 32 U/L (ref 0–44)
AST: 106 U/L — ABNORMAL HIGH (ref 15–41)
Albumin: 3.1 g/dL — ABNORMAL LOW (ref 3.5–5.0)
Alkaline Phosphatase: 167 U/L — ABNORMAL HIGH (ref 38–126)
Anion gap: 13 (ref 5–15)
BUN: <5 mg/dL — ABNORMAL LOW (ref 6–20)
CO2: 24 mmol/L (ref 22–32)
Calcium: 8.9 mg/dL (ref 8.9–10.3)
Chloride: 99 mmol/L (ref 98–111)
Creatinine, Ser: 0.55 mg/dL — ABNORMAL LOW (ref 0.61–1.24)
GFR calc Af Amer: >60 mL/min (ref >60)
GFR calc non Af Amer: >60 mL/min (ref >60)
Glucose, Bld: 115 mg/dL — ABNORMAL HIGH (ref 70–99)
Potassium: 2.9 mmol/L — ABNORMAL LOW (ref 3.5–5.1)
Sodium: 136 mmol/L (ref 135–145)
Total Bilirubin: 3.8 mg/dL — ABNORMAL HIGH (ref 0.3–1.2)
Total Protein: 8.1 g/dL (ref 6.5–8.1)

## 2018-12-20 LAB — HEPATITIS A ANTIBODY, TOTAL: hep A Total Ab: POSITIVE — AB

## 2018-12-20 LAB — GLUCOSE, CAPILLARY: Glucose-Capillary: 114 mg/dL — ABNORMAL HIGH (ref 70–99)

## 2018-12-20 LAB — HEPATITIS C ANTIBODY: HCV Ab: 0.1 s/co ratio (ref 0.0–0.9)

## 2018-12-20 LAB — HEPATITIS B SURFACE ANTIBODY,QUALITATIVE: Hep B S Ab: NONREACTIVE

## 2018-12-20 LAB — HEPATITIS B SURFACE ANTIGEN: Hepatitis B Surface Ag: NEGATIVE

## 2018-12-20 LAB — HEPATITIS B CORE ANTIBODY, TOTAL: Hep B Core Total Ab: NEGATIVE

## 2018-12-20 LAB — MAGNESIUM: Magnesium: 1.2 mg/dL — ABNORMAL LOW (ref 1.7–2.4)

## 2018-12-20 MED ORDER — HALOPERIDOL LACTATE 5 MG/ML IJ SOLN
2.0000 mg | Freq: Three times a day (TID) | INTRAMUSCULAR | Status: DC | PRN
  Administered 2018-12-20 – 2018-12-21 (×3): 2 mg via INTRAMUSCULAR
  Filled 2018-12-20 (×2): qty 1

## 2018-12-20 MED ORDER — HALOPERIDOL LACTATE 5 MG/ML IJ SOLN
2.0000 mg | Freq: Once | INTRAMUSCULAR | Status: DC
  Filled 2018-12-20: qty 1

## 2018-12-20 MED ORDER — LORAZEPAM 2 MG/ML IJ SOLN
2.0000 mg | Freq: Once | INTRAMUSCULAR | Status: AC
  Administered 2018-12-20: 2 mg via INTRAMUSCULAR
  Filled 2018-12-20: qty 1

## 2018-12-20 MED ORDER — MAGNESIUM SULFATE 2 GM/50ML IV SOLN: 2.0000 g | Freq: Once | INTRAVENOUS | Status: DC

## 2018-12-20 MED ORDER — LORAZEPAM 1 MG PO TABS: 2.0000 mg | ORAL_TABLET | ORAL | Status: DC | PRN

## 2018-12-20 MED ORDER — LORAZEPAM 2 MG/ML IJ SOLN
2.0000 mg | INTRAMUSCULAR | Status: DC | PRN
  Administered 2018-12-20 – 2018-12-22 (×7): 2 mg via INTRAVENOUS
  Filled 2018-12-20 (×7): qty 1

## 2018-12-20 MED ORDER — MAGNESIUM SULFATE 2 GM/50ML IV SOLN
2.0000 g | Freq: Once | INTRAVENOUS | Status: AC
  Administered 2018-12-20: 2 g via INTRAVENOUS
  Filled 2018-12-20: qty 50

## 2018-12-20 MED ORDER — POTASSIUM CHLORIDE 10 MEQ/100ML IV SOLN
10.0000 meq | INTRAVENOUS | Status: AC
  Administered 2018-12-20 (×6): 10 meq via INTRAVENOUS
  Filled 2018-12-20 (×6): qty 100

## 2018-12-20 MED ORDER — LORAZEPAM 2 MG/ML IJ SOLN
2.0000 mg | INTRAMUSCULAR | Status: DC | PRN
  Administered 2018-12-20 (×3): 2 mg via INTRAVENOUS
  Filled 2018-12-20 (×4): qty 1

## 2018-12-20 MED ORDER — HALOPERIDOL 1 MG PO TABS
2.0000 mg | ORAL_TABLET | Freq: Three times a day (TID) | ORAL | Status: DC | PRN
  Filled 2018-12-20: qty 1

## 2018-12-20 MED ORDER — LORAZEPAM 2 MG/ML IJ SOLN: 2.0000 mg | INTRAMUSCULAR | Status: DC | PRN

## 2018-12-20 MED ORDER — LORAZEPAM 1 MG PO TABS
2.0000 mg | ORAL_TABLET | ORAL | Status: DC | PRN
  Administered 2018-12-22 – 2018-12-23 (×2): 2 mg via ORAL
  Filled 2018-12-20 (×2): qty 2

## 2018-12-20 NOTE — Progress Notes (Signed)
Subjective: Telephone interpretor present during the visit. James Huff was sleeping in his bed with soft restraints bilaterally when the team entered the room. He woke up with ease, but was not responding to our questions. It is unclear weather he was angry and did not feel like speaking or if he was confused. On the rare occasion he did speak, it was inaudible to the interpretor over the phone. I reassured him that we aren't doing anything to hurt him, and he is in the hospital to keep him safe.   Objective:  Vital signs in last 24 hours: Vitals:   12/20/18 0758 12/20/18 1032 12/20/18 1153 12/20/18 1216  BP: 139/83 104/77  126/86  Pulse: (!) 126 90  (!) 124  Resp: 18 15  (!) 25  Temp: 98.1 F (36.7 C)  98.5 F (36.9 C)   TempSrc: Axillary  Axillary   SpO2: 99% 99%  98%  Weight:      Height:       Physical Exam Constitutional:      Appearance: He is ill-appearing and diaphoretic.  HENT:     Head: Normocephalic and atraumatic.  Cardiovascular:     Rate and Rhythm: Regular rhythm. Tachycardia present.     Heart sounds: Normal heart sounds. No murmur. No friction rub. No gallop.   Pulmonary:     Effort: Pulmonary effort is normal. No respiratory distress.     Breath sounds: Normal breath sounds. No wheezing or rales.  Abdominal:     General: Abdomen is flat.     Palpations: Abdomen is soft.  Neurological:     General: No focal deficit present.     Mental Status: He is alert. Mental status is at baseline.     Comments: Unable to access orientation due to lack of responses from the patient   Psychiatric:     Comments: Patient appeared anxious      Assessment/Plan:  Active Problems:   Seizure (HCC)   Alcohol withdrawal syndrome, with delirium (HCC)  In summary,James Huff is a 38 y.o male with a history of alcohol withdrawal related seizures who presented on 7/1 aftera syncopal event in the context of significantly elevated LFTs, thrombocytopenia, loss of bowel and  bladder function, tongue laceration, tremors, and confusion, and improvement of symptoms after Ativan administration.These findings are very suggestive of alcohol withdrawal seizures.  #Alcohol Withdrawl: Given the patients previous history of heavy drinking, elevated LFTs in addition to thrombocytopenia, tremors,diminished coordination, and tongue laceration on physical exam these findings are highly suggestive of seizure due to alcohol withdrawal. The patient noted his last drink was roughly 2 days before admission, which is within the expected window of starting to have withdrawal symptoms. Today (7/3) the patient still seems to be confused. This is concerning for Wernicke's Encephalopathy.  - Vitamin B1 level pending - Continue IVF - Continue IV banana bag -  James Huff continues to show signs of confusion and aggitation. It appears his AMS is still clouding his judgement, and IVC is still necessary (7/2). The patient is still responding to PRN ativan, however if he does not show signs of physiologic improvement, it will need to be considered to transfer to an ICU setting. We will continue to monitor.   #Seizure: Witnesses on the bus reported to EMS that the seizures were "full body" jerks. The tongue laceration and loss of bowel and bladder is consistent with a seizure.  - PRN Ativan for seizures  - Continue IV thiamine  #Transaminitis: AST  176 and ALT 46 upon admission, consistent with excessive alcohol use. However, viral hepatitis should be investigated and ruled out.  - AST 176, ALT 46 upon admission. Trending down AST 102 ALT 30 today. - Hepatitis A total antibody ordered, Hepatitis B core, surface and antigen panel ordered, will follow up on these labs  #Thrombocytopenia: Platelet count 29 on admission, likely due to alcohol abuse. Platelet count 33 today - Daily CBCs to monitor Hgb & platelet count - Will consider platelet transfusion if <10  #Hyponatremia RESOLVED - 131 on  admission, 137 today - Monitor with daily CMPs - Continue IVF  #Hypomangnesemia -Repleted on 7/1 - 1.2 today, will replete again  #Hypokalemia  - Repealed 7/3  Dispo: expected LOS 2 days  Earlene Plater, MD 12/20/2018, 2:55 PM Pager: [phone]

## 2018-12-20 NOTE — Progress Notes (Signed)
Patient awake, restless, uncooperative with staff, trying to take off his mittens, to get out of bed. HR elevated in 140s. MD made aware.

## 2018-12-20 NOTE — Progress Notes (Signed)
CIWA score 12 - patient more calm at this time, resting quiet in bed; sitter at bedside; will hold Ativan. Will continue to monitor.

## 2018-12-20 NOTE — Progress Notes (Signed)
No contacts available in the contact information list and patient is unable to give [place] any information. Will continue to monitor.

## 2018-12-20 NOTE — Progress Notes (Signed)
Dr. Ronnald Ramp and Dr. Lily Kocher came to evaluate pt approximately 2217 and 0053 because pt's HR and BP were elevated (see Epic); agitated anxious, sweaty, has tremors. Pt was medicated per Dr.'s orders

## 2018-12-21 LAB — COMPREHENSIVE METABOLIC PANEL
ALT: 35 U/L (ref 0–44)
ALT: 36 U/L (ref 0–44)
AST: 103 U/L — ABNORMAL HIGH (ref 15–41)
AST: 98 U/L — ABNORMAL HIGH (ref 15–41)
Albumin: 2.7 g/dL — ABNORMAL LOW (ref 3.5–5.0)
Albumin: 2.8 g/dL — ABNORMAL LOW (ref 3.5–5.0)
Alkaline Phosphatase: 139 U/L — ABNORMAL HIGH (ref 38–126)
Alkaline Phosphatase: 160 U/L — ABNORMAL HIGH (ref 38–126)
Anion gap: 10 (ref 5–15)
Anion gap: 12 (ref 5–15)
BUN: <5 mg/dL — ABNORMAL LOW (ref 6–20)
BUN: <5 mg/dL — ABNORMAL LOW (ref 6–20)
CO2: 24 mmol/L (ref 22–32)
CO2: 19 mmol/L — ABNORMAL LOW (ref 22–32)
Calcium: 8.3 mg/dL — ABNORMAL LOW (ref 8.9–10.3)
Calcium: 8.4 mg/dL — ABNORMAL LOW (ref 8.9–10.3)
Chloride: 105 mmol/L (ref 98–111)
Chloride: 103 mmol/L (ref 98–111)
Creatinine, Ser: 0.5 mg/dL — ABNORMAL LOW (ref 0.61–1.24)
Creatinine, Ser: 0.52 mg/dL — ABNORMAL LOW (ref 0.61–1.24)
GFR calc Af Amer: >60 mL/min (ref >60)
GFR calc Af Amer: >60 mL/min (ref >60)
GFR calc non Af Amer: >60 mL/min (ref >60)
GFR calc non Af Amer: >60 mL/min (ref >60)
Glucose, Bld: 95 mg/dL (ref 70–99)
Glucose, Bld: 171 mg/dL — ABNORMAL HIGH (ref 70–99)
Potassium: 3.2 mmol/L — ABNORMAL LOW (ref 3.5–5.1)
Potassium: 3.6 mmol/L (ref 3.5–5.1)
Sodium: 139 mmol/L (ref 135–145)
Sodium: 134 mmol/L — ABNORMAL LOW (ref 135–145)
Total Bilirubin: 4.1 mg/dL — ABNORMAL HIGH (ref 0.3–1.2)
Total Bilirubin: 4.3 mg/dL — ABNORMAL HIGH (ref 0.3–1.2)
Total Protein: 7.9 g/dL (ref 6.5–8.1)
Total Protein: 7.5 g/dL (ref 6.5–8.1)

## 2018-12-21 LAB — CBC
HCT: 33 % — ABNORMAL LOW (ref 39.0–52.0)
Hemoglobin: 11.5 g/dL — ABNORMAL LOW (ref 13.0–17.0)
MCH: 33.5 pg (ref 26.0–34.0)
MCHC: 34.8 g/dL (ref 30.0–36.0)
MCV: 96.2 fL (ref 80.0–100.0)
Platelets: 71 10*3/uL — ABNORMAL LOW (ref 150–400)
RBC: 3.43 MIL/uL — ABNORMAL LOW (ref 4.22–5.81)
RDW: 14.4 % (ref 11.5–15.5)
WBC: 7.2 10*3/uL (ref 4.0–10.5)
nRBC: 0 % (ref 0.0–0.2)

## 2018-12-21 LAB — GLUCOSE, CAPILLARY
Glucose-Capillary: 85 mg/dL (ref 70–99)
Glucose-Capillary: 116 mg/dL — ABNORMAL HIGH (ref 70–99)

## 2018-12-21 LAB — MAGNESIUM
Magnesium: 1.6 mg/dL — ABNORMAL LOW (ref 1.7–2.4)
Magnesium: 1.6 mg/dL — ABNORMAL LOW (ref 1.7–2.4)

## 2018-12-21 LAB — PHOSPHORUS: Phosphorus: 3.6 mg/dL (ref 2.5–4.6)

## 2018-12-21 LAB — VITAMIN B1: Vitamin B1 (Thiamine): 118.3 nmol/L (ref 66.5–200.0)

## 2018-12-21 MED ORDER — MAGNESIUM SULFATE 2 GM/50ML IV SOLN
2.0000 g | Freq: Once | INTRAVENOUS | Status: AC
  Administered 2018-12-21: 2 g via INTRAVENOUS
  Filled 2018-12-21: qty 50

## 2018-12-21 MED ORDER — POTASSIUM CHLORIDE CRYS ER 20 MEQ PO TBCR
40.0000 meq | EXTENDED_RELEASE_TABLET | Freq: Two times a day (BID) | ORAL | Status: DC
  Administered 2018-12-21 (×2): 40 meq via ORAL
  Filled 2018-12-21 (×2): qty 2

## 2018-12-21 NOTE — Progress Notes (Signed)
  Date: 12/21/2018  Patient name: Tal Kempker  Medical record number: [id number]  Date of birth: 08/04/1980   I have seen and evaluated this patient and I have discussed the plan of care with the house staff. Please see their note for complete details. I concur with their findings with the following additions/corrections: Mr Conover was seen this morning on team rounds.  Per Dr. Sheppard Coil, the patient is improved today.  He was unable to unlock his phone, find his brothers telephone number, attempt to call him, but the brother was not answering.  He still feels anxious and requiring his Librium.  Ativan frequency was increased overnight to every 3 hours.  Dr. Sheppard Coil will attempt to make contact with his brother to start planning dispo.  Dr. Redgie Grayer daily progress note to come shortly.  Bartholomew Crews, MD 12/21/2018, 4:38 PM

## 2018-12-21 NOTE — Progress Notes (Signed)
Earlier tonight patient was restless and agitated.  Pulling on restraints and trying to get out of bed. Heart Rate in 130s and 140s.  I called MD to see if we could up the frequency of Ativan.  He moved it from q4 to q3.  I gave 2mg  Ativan IV and gave PO Librium.  At 12am patient is sleeping resting comfortably.  HR is 103.  Will continue to monitor.

## 2018-12-21 NOTE — Progress Notes (Signed)
Subjective: Mr. Hogeland was resting in bed comfortably. He did not appear as restless as the days prior, but he said he feels anxious. However, the patient remains confused. He believes he is in [place].  He states that he was brought here by his brother after he was kicked out from where he lives with his girlfriend. We explained to him that he is in [place], [place] and beeing treated for alcohol withdrawl seizures. We asked for information of relatives that we can reach out to about his condition, but he states "she knows, I already told her that I am here". He said he could give [place] his brother's phone number, but was unable to get to his number in the phone.   Objective:  Vital signs in last 24 hours: Vitals:   12/20/18 1845 12/20/18 1900 12/20/18 2327 12/21/18 0500  BP: 116/86  104/73   Pulse: (!) 132  (!) 107   Resp:   15   Temp:  97.7 F (36.5 C) 98 F (36.7 C) 97.7 F (36.5 C)  TempSrc:  Axillary Oral Axillary  SpO2:   99%   Weight:      Height:       Physical Exam Constitutional:      General: He is not in acute distress.    Appearance: He is diaphoretic.  HENT:     Head: Normocephalic and atraumatic.  Eyes:     General: Scleral icterus present.  Cardiovascular:     Rate and Rhythm: Regular rhythm. Tachycardia present.     Pulses: Normal pulses.     Heart sounds: Normal heart sounds. No murmur. No friction rub. No gallop.   Pulmonary:     Effort: No respiratory distress.     Breath sounds: Normal breath sounds. No wheezing or rales.  Abdominal:     General: Abdomen is flat. Bowel sounds are normal. There is no distension.     Palpations: Abdomen is soft.     Tenderness: There is no abdominal tenderness. There is no guarding.  Skin:    General: Skin is warm.  Neurological:     Mental Status: He is alert. He is disoriented.     Comments: Alert and oriented x1. Knows name but thinks he is in [place] and doesn't know the year.      Assessment/Plan:   Active Problems:   Seizure ([place])   Alcohol withdrawal syndrome, with delirium ([place])  In summary,[name] is a 38 y.o male with a history of alcohol withdrawal related seizures who presentedon 7/1aftera syncopal event in the context of significantly elevated LFTs, thrombocytopenia, loss of bowel and bladder function, tongue laceration, tremors, and confusion, and improvement of symptoms after Ativan administration.These findings are indicative of alcohol withdrawal seizures.  #Alcohol Withdrawl: Given the patients previous history of heavy drinking, elevated LFTs in addition to thrombocytopenia, tremors,diminished coordination, and tongue laceration on physical exam these findings are highly suggestive of seizure due to alcohol withdrawal. The patient noted his last drink was roughly 2 daysbefore admission, which is within the expected window of starting to have withdrawal symptoms.Today (7/4) the patient remains confused. This is concerning for Wernicke's Encephalopathy.  - Vitamin B1 levelpending - Continue IVF - ContinueIV banana bag -  Mr. Holst continues to show signs of confusion and agitation last night. It appears his AMS is still clouding his judgement, and IVC is still necessary at this point (initiated on 7/2). The patient is still responding to PRN ativan and librium taper, however if  he does not show signs of physiologic improvement, it will need to be considered to transfer to an ICU setting. We will continue to monitor.   #Seizure: Witnesses on the bus reported to EMS that the seizures were "full body" jerks. The tongue laceration and loss of bowel and bladder is consistent with a seizure. The patient has not seized since admission, but continues to have agitation and unsteady vital signs.  - Continue Librium taper (day 2/4) - PRN Ativan for seizures  - Continue IV thiamine  #Transaminitis: AST 176 and ALT 46 upon admission, consistent with excessive alcohol use.  However, viral hepatitis should be investigated and ruled out.  - AST 176, ALT 46 upon admission.Trending down AST 103 ALT 35 today. - Hepatitis A total antibody ordered, Hepatitis B core, surface and antigen panel negative  #Thrombocytopenia: Platelet count 29 on admission, likely due to alcohol abuse. Platelet count increased to 71 today - Daily CBCs to monitor Hgb & platelet count - Will consider platelet transfusion if <10  #HyponatremiaRESOLVED - 131 on admission, 139 today - Monitor with daily CMPs - Continue IVF  #Hypomangnesemia -Repleted on 7/1 and 7/3 - 1.6 today, will continue to monitor as needed  #Hypokalemia - 3.2 today - Repleated 7/3, 7/4  Dispo: Anticipated discharge in approximately 3 day(s).   Kirt BoysAlexander, Meiko Ives, MD 12/21/2018, 6:57 AM Pager: [phone]

## 2018-12-22 LAB — COMPREHENSIVE METABOLIC PANEL
ALT: 35 U/L (ref 0–44)
AST: 87 U/L — ABNORMAL HIGH (ref 15–41)
Albumin: 2.9 g/dL — ABNORMAL LOW (ref 3.5–5.0)
Alkaline Phosphatase: 158 U/L — ABNORMAL HIGH (ref 38–126)
Anion gap: 12 (ref 5–15)
BUN: <5 mg/dL — ABNORMAL LOW (ref 6–20)
CO2: 21 mmol/L — ABNORMAL LOW (ref 22–32)
Calcium: 8.2 mg/dL — ABNORMAL LOW (ref 8.9–10.3)
Chloride: 103 mmol/L (ref 98–111)
Creatinine, Ser: 0.48 mg/dL — ABNORMAL LOW (ref 0.61–1.24)
GFR calc Af Amer: >60 mL/min (ref >60)
GFR calc non Af Amer: >60 mL/min (ref >60)
Glucose, Bld: 115 mg/dL — ABNORMAL HIGH (ref 70–99)
Potassium: 3.4 mmol/L — ABNORMAL LOW (ref 3.5–5.1)
Sodium: 136 mmol/L (ref 135–145)
Total Bilirubin: 3.9 mg/dL — ABNORMAL HIGH (ref 0.3–1.2)
Total Protein: 7.5 g/dL (ref 6.5–8.1)

## 2018-12-22 LAB — CBC
HCT: 34.7 % — ABNORMAL LOW (ref 39.0–52.0)
Hemoglobin: 12 g/dL — ABNORMAL LOW (ref 13.0–17.0)
MCH: 33.2 pg (ref 26.0–34.0)
MCHC: 34.6 g/dL (ref 30.0–36.0)
MCV: 96.1 fL (ref 80.0–100.0)
Platelets: 95 10*3/uL — ABNORMAL LOW (ref 150–400)
RBC: 3.61 MIL/uL — ABNORMAL LOW (ref 4.22–5.81)
RDW: 14.4 % (ref 11.5–15.5)
WBC: 8.3 10*3/uL (ref 4.0–10.5)
nRBC: 0 % (ref 0.0–0.2)

## 2018-12-22 LAB — GLUCOSE, CAPILLARY: Glucose-Capillary: 96 mg/dL (ref 70–99)

## 2018-12-22 LAB — MAGNESIUM: Magnesium: 1.7 mg/dL (ref 1.7–2.4)

## 2018-12-22 MED ORDER — POTASSIUM CHLORIDE CRYS ER 20 MEQ PO TBCR
40.0000 meq | EXTENDED_RELEASE_TABLET | Freq: Two times a day (BID) | ORAL | Status: AC
  Administered 2018-12-22 (×2): 40 meq via ORAL
  Filled 2018-12-22 (×2): qty 2

## 2018-12-22 MED ORDER — MAGNESIUM SULFATE 2 GM/50ML IV SOLN
2.0000 g | Freq: Once | INTRAVENOUS | Status: AC
  Administered 2018-12-22: 2 g via INTRAVENOUS
  Filled 2018-12-22: qty 50

## 2018-12-22 NOTE — Progress Notes (Signed)
Pt assessed, resting and calm in bed, sitter at bedside. Pt is under IVC at this time. Restraints removed as pt is no longer attempting to remove medical devices. Skin intact, ROM completed. Pt up to bedside commode. Will continue to monitor.

## 2018-12-22 NOTE — Progress Notes (Signed)
Subjective: [name] was seen on rounds this morning. His CIWAS have been improving to single digits. E remains sweaty and confused. He is oriented to self only. He knows that he was in [place] before he came here, but does not try to confabulate how he got her today; just states that he does not know. He is still unable to provide a contact number for his brother we will work to obtain this as his mentation hopefully continues to clear. He reports drink at least 12 beers daily prior to admission. He has no complaints or questions at this time.  Objective:  Vital signs in last 24 hours: Vitals:   12/22/18 0454 12/22/18 0700 12/22/18 0840 12/22/18 1224  BP: 130/85 105/74  118/86  Pulse: (!) 111 88  94  Resp: 18 17  (!) 22  Temp: 99.5 F (37.5 C)  98.5 F (36.9 C) 98.9 F (37.2 C)  TempSrc: Oral  Oral Oral  SpO2: 100% 100%  100%  Weight:      Height:       Physical Exam Constitutional:      General: He is not in acute distress.    Appearance: He is diaphoretic.  HENT:     Head: Normocephalic and atraumatic.  Eyes:     General: Scleral icterus present.  Cardiovascular:     Rate and Rhythm: Normal rate and regular rhythm.     Pulses: Normal pulses.     Heart sounds: Normal heart sounds. No murmur. No friction rub. No gallop.   Pulmonary:     Effort: No respiratory distress.     Breath sounds: Normal breath sounds. No wheezing or rales.  Abdominal:     General: Abdomen is flat. Bowel sounds are normal. There is no distension.     Palpations: Abdomen is soft.     Tenderness: There is no abdominal tenderness. There is no guarding.  Skin:    General: Skin is warm.  Neurological:     Mental Status: He is alert. He is disoriented.     Comments: Alert and oriented x1 (to name)    Assessment/Plan:  Active Problems:   Seizure (HCC)   Alcohol withdrawal syndrome, with delirium (HCC)  In summary,[name] is a 38 y.o male with a history of alcohol withdrawal related seizures  who presentedon 7/1aftera syncopal event in the context of significantly elevated LFTs, thrombocytopenia, loss of bowel and bladder function, tongue laceration, tremors, and confusion, and improvement of symptoms after Ativan administration.These findings are indicative of alcohol withdrawal seizures.  #Transaminitis: #Alcohol Withdrawl: # Withdrawal Seizures Patient reports drinking at least 12 beers per day PTA. LFTs consistent with alcohol use, and finding of cirrhosis on ultrasound. His last drink was roughly 2 daysbefore admission. Patient had a seizure prior to admission, but has not had any further seizures. Patient remains confused after several days of withdrawal, concerning for Wernicke's Encephalopathy. Patient is IVC'd due to risk of harm to himself. > Vitamin B1 levelWNL  - Continue IVF - Continue Librium taper (day 3/4) - CIWA w/ Ativan - Haldol PRN, Aggitation - PRN Ativan for seizures  - Continue IV thiamine  - Continue Folic acid  #Thrombocytopenia: Platelet count 29 on admission, alcohol induced. Platelet count improving. - PLTs  95 on 7/5 - Daily CBCs  - Will consider platelet transfusion if <10  #HyponatremiaRESOLVED - 131 on admission > 139 #Hypomangnesemia Resolved - 1.2 > 1.7 ed #Hypokalemia - 3.4 today - PO KCl - 1 additional dose  of IV Mg  Dispo: Anticipated discharge in approximately 2-3 day(s).   Neva Seat, MD 12/22/2018, 12:56 PM

## 2018-12-22 NOTE — Progress Notes (Signed)
Interpreter used, thank you for your assistance!   Brother Cassandria Santee called and asked if patient was getting discharged today. He said that if his brother could stay for another eight days, he'd be able to pick him up from the hospital. I discussed that the patient was not ready for discharge today and that I wasn't sure tomorrow would be discharge either, but that the doctors would be monitoring him and deciding. He shared his number so that we could call when it was actually time for discharge. He also shared that prior to admission, the patient had not slept in 15 days.  Brother number! [phone], updated in chart as well.

## 2018-12-23 DIAGNOSIS — G9340 Encephalopathy, unspecified: Secondary | ICD-10-CM | POA: Diagnosis present

## 2018-12-23 DIAGNOSIS — D696 Thrombocytopenia, unspecified: Secondary | ICD-10-CM | POA: Diagnosis present

## 2018-12-23 DIAGNOSIS — K7030 Alcoholic cirrhosis of liver without ascites: Secondary | ICD-10-CM | POA: Diagnosis present

## 2018-12-23 LAB — COMPREHENSIVE METABOLIC PANEL
ALT: 31 U/L (ref 0–44)
AST: 69 U/L — ABNORMAL HIGH (ref 15–41)
Albumin: 2.5 g/dL — ABNORMAL LOW (ref 3.5–5.0)
Alkaline Phosphatase: 132 U/L — ABNORMAL HIGH (ref 38–126)
Anion gap: 7 (ref 5–15)
BUN: <5 mg/dL — ABNORMAL LOW (ref 6–20)
CO2: 23 mmol/L (ref 22–32)
Calcium: 8.7 mg/dL — ABNORMAL LOW (ref 8.9–10.3)
Chloride: 106 mmol/L (ref 98–111)
Creatinine, Ser: 0.48 mg/dL — ABNORMAL LOW (ref 0.61–1.24)
GFR calc Af Amer: >60 mL/min (ref >60)
GFR calc non Af Amer: >60 mL/min (ref >60)
Glucose, Bld: 95 mg/dL (ref 70–99)
Potassium: 4 mmol/L (ref 3.5–5.1)
Sodium: 136 mmol/L (ref 135–145)
Total Bilirubin: 4.1 mg/dL — ABNORMAL HIGH (ref 0.3–1.2)
Total Protein: 7.1 g/dL (ref 6.5–8.1)

## 2018-12-23 LAB — PHOSPHORUS: Phosphorus: 3.7 mg/dL (ref 2.5–4.6)

## 2018-12-23 LAB — CBC
HCT: 32.3 % — ABNORMAL LOW (ref 39.0–52.0)
Hemoglobin: 11.1 g/dL — ABNORMAL LOW (ref 13.0–17.0)
MCH: 33.7 pg (ref 26.0–34.0)
MCHC: 34.4 g/dL (ref 30.0–36.0)
MCV: 98.2 fL (ref 80.0–100.0)
Platelets: 105 10*3/uL — ABNORMAL LOW (ref 150–400)
RBC: 3.29 MIL/uL — ABNORMAL LOW (ref 4.22–5.81)
RDW: 14.6 % (ref 11.5–15.5)
WBC: 7 10*3/uL (ref 4.0–10.5)
nRBC: 0 % (ref 0.0–0.2)

## 2018-12-23 LAB — MAGNESIUM: Magnesium: 1.5 mg/dL — ABNORMAL LOW (ref 1.7–2.4)

## 2018-12-23 LAB — GLUCOSE, CAPILLARY: Glucose-Capillary: 78 mg/dL (ref 70–99)

## 2018-12-23 LAB — VITAMIN B12: Vitamin B-12: 1434 pg/mL — ABNORMAL HIGH (ref 180–914)

## 2018-12-23 LAB — BILIRUBIN, DIRECT: Bilirubin, Direct: 2.3 mg/dL — ABNORMAL HIGH (ref 0.0–0.2)

## 2018-12-23 MED ORDER — THIAMINE HCL 100 MG/ML IJ SOLN
250.0000 mg | Freq: Every day | INTRAVENOUS | Status: AC
  Administered 2018-12-25 – 2018-12-29 (×5): 250 mg via INTRAVENOUS
  Filled 2018-12-23 (×5): qty 2.5

## 2018-12-23 MED ORDER — LORAZEPAM 1 MG PO TABS
2.0000 mg | ORAL_TABLET | ORAL | Status: DC
  Administered 2018-12-23 – 2018-12-24 (×5): 2 mg via ORAL
  Filled 2018-12-23 (×5): qty 2

## 2018-12-23 MED ORDER — THIAMINE HCL 100 MG/ML IJ SOLN
500.0000 mg | Freq: Three times a day (TID) | INTRAVENOUS | Status: AC
  Administered 2018-12-23 – 2018-12-24 (×6): 500 mg via INTRAVENOUS
  Filled 2018-12-23 (×6): qty 5

## 2018-12-23 MED ORDER — LORAZEPAM 2 MG/ML IJ SOLN: 2.0000 mg | INTRAMUSCULAR | Status: DC

## 2018-12-23 MED ORDER — MAGNESIUM SULFATE 2 GM/50ML IV SOLN
2.0000 g | Freq: Once | INTRAVENOUS | Status: AC
  Administered 2018-12-23: 09:00:00 2 g via INTRAVENOUS
  Filled 2018-12-23: qty 50

## 2018-12-23 NOTE — Progress Notes (Signed)
Talked to patient's brother, [name] on the phone for >30 minutes via [employer]. James Huff says James Huff has no PCP. He has been a heavy drinker for awhile now, and he doesn't have incite into his problems. He doesn't think he is an alcoholic. [name] was attempting to send him to [place] to get treatment for his alcoholism, which is why he was on the bus. The patient has been in 2 romantic relationships over the last 5 years, both of which fell apart due to his alcoholism. As far as his memory is concerned, the patient had memory problems for the last 2-3 months. He frequently repeats himself and is extremely forgetful. I told him in the hospital, [name] knows his name, but thinks he is [name] and doesn't know the year. [name] says this is not normal for him to be this confused, but isn't surprised. [name] says [name] has never been hospitalized for withdrawal in the past, but he did try to have him hospitalized him over a year ago for alcoholism. Apparently James Huff told people he was going to the bathroom, but escaped. He was lost for 2 days before the family found him wandering around the street.  [name] had no recollection of leaving the hospital at that time, and says he left his home and got lost.   As far as his questionable psych history, [name] notes that the last 2 to 3 months, [name] has been extremely talkative. He says that he seems to understand what you say to him, but his answers are lengthy and doesn't really make any sense. During this time, he hasn't slept well either. He hadn't slept in 2 weeks prior to this hospitalization, raising concerns for potentially untreated bipolar disorder. [name] said he recently had surgery and was instructed not to take any long trips, so he isn't able to pick up [name] once he is discharged. He is currently working on finding him a ride.

## 2018-12-23 NOTE — Progress Notes (Signed)
Subjective: Pt. Seen on rounds this AM. He said he felt anxious this AM but feels better after receiving PRN ativan. Otherwise he appears to be calm and comfortable. He is no longer in soft restraints. He says it is okay to call his brother to give an update. No longer has a tremor. Pt still confused to place or year. [place] He was reminded that he is in the hospital for EtOH withdrawal seizures and was found at the bus stop.   Objective:  Vital signs in last 24 hours: Vitals:   12/22/18 1800 12/22/18 1939 12/22/18 2122 12/22/18 2344  BP:  103/78 112/85 108/75  Pulse: 99 94 87 79  Resp:  (!) 21 20 18   Temp:  97.8 F (36.6 C) 98.2 F (36.8 C) 97.8 F (36.6 C)  TempSrc:  Oral Oral Oral  SpO2:  100% 100% 98%  Weight:      Height:       Physical Exam Constitutional:      General: He is not in acute distress.    Appearance: He is not toxic-appearing.  HENT:     Head: Normocephalic and atraumatic.  Eyes:     General: Scleral icterus present.  Cardiovascular:     Rate and Rhythm: Normal rate and regular rhythm.     Pulses: Normal pulses.     Heart sounds: Normal heart sounds. No murmur. No friction rub. No gallop.   Pulmonary:     Effort: No respiratory distress.     Breath sounds: Normal breath sounds. No wheezing or rales.  Abdominal:     General: Abdomen is flat. There is no distension.     Palpations: Abdomen is soft.     Tenderness: There is no abdominal tenderness. There is no guarding.  Musculoskeletal:        General: No swelling or deformity.     Right lower leg: No edema.     Left lower leg: No edema.  Skin:    Coloration: Skin is jaundiced (slightly).     Findings: No bruising or erythema.  Neurological:     Mental Status: He is alert. He is disoriented.     Sensory: No sensory deficit.     Motor: No weakness.     Comments: Oriented x 1. Knows name but doesn't know place or year.  Psychiatric:        Mood and Affect: Mood normal.     Assessment/Plan:  Active Problems:   Seizure (HCC)   Alcohol withdrawal syndrome, with delirium (HCC)  In summary,James Huff is a 38 y.o male with a history of alcohol withdrawal related seizures who presentedon 7/1aftera syncopal event in the context of significantly elevated LFTs, thrombocytopenia, loss of bowel and bladder function, tongue laceration, tremors, and confusion, and improvement of symptoms after Ativan administration.These findings are indicative of alcohol withdrawal seizures.   Of note, yesterday (7/6)  the nurse was able to talk to the brother. The brother said James Huff didn't sleep for 2 weeks straight prior to this hospitalization. James Huff says that he does have difficulty sleeping once or twice a month, and endorses periods of time where he feels like he is full of energy and on top of the world. He doesn't see a PCP back at home. Concerning for undiagnosed bipolar disorder. Will call brother for collateral info.  #Transaminitis: #Alcohol Withdrawl: # Withdrawal Seizures Patient reports drinking at least 12 beers per day PTA. LFTs consistent with alcohol use,  and finding of cirrhosis on ultrasound. His last drink was roughly 2 daysbefore admission. Patient had a seizure prior to admission, but has not had any further seizures. Patient remains confused after several days of withdrawal, concerning for Wernicke's Encephalopathy.Patient is IVC'd due to risk of harm to himself. His agitation has improved, as restraints are removed. Received one dose of PRN Ativan for anxiety this AM. > Vitamin B1 levelWNL  - Increased thiamine dose to 500mg  TID for 2 days followed by 250mg  daily for 5 days. (day 1/5) - Continue IVF - Continue Librium taper (day 3/4) - CIWA w/ Ativan. Has been <9 over 24 hrs. - Haldol PRN, Aggitation - PRN Ativan for seizures  - Continue Folic acid  #Thrombocytopenia: Platelet count 29 on admission, alcohol induced. Platelet count  improving. - PLTs  95 on 7/5 - Daily CBCs  - Will consider platelet transfusion if <10  #Questionable psych history bipolar disorder? - One of the nurses was able to talk with the patient's brother yesterday and noted the patient was up for 15 days straight without sleeping. When talking with the patient this AM, he says he has periods of time where he feels he is full of energy and on top of the world.  - Gain collateral from brother - Consider psych consult  #HyponatremiaRESOLVED - 131 on admission > 139 #Hypomangnesemia Resolved - 1.2 > 1.7 ED #Hypokalemia - 3.4 today - PO KCl - 1 additional dose of IV Mg today (7/6)  Dispo: Anticipated discharge in approximately 2-3 day(s).  James Huff (Brother) phone: [phone]  Earlene Plater, MD 12/23/2018, 10:28 AM Pager: [phone]

## 2018-12-24 DIAGNOSIS — G40509 Epileptic seizures related to external causes, not intractable, without status epilepticus: Secondary | ICD-10-CM

## 2018-12-24 DIAGNOSIS — F1020 Alcohol dependence, uncomplicated: Secondary | ICD-10-CM

## 2018-12-24 LAB — CBC
HCT: 32.6 % — ABNORMAL LOW (ref 39.0–52.0)
Hemoglobin: 11.4 g/dL — ABNORMAL LOW (ref 13.0–17.0)
MCH: 34.2 pg — ABNORMAL HIGH (ref 26.0–34.0)
MCHC: 35 g/dL (ref 30.0–36.0)
MCV: 97.9 fL (ref 80.0–100.0)
Platelets: 152 10*3/uL (ref 150–400)
RBC: 3.33 MIL/uL — ABNORMAL LOW (ref 4.22–5.81)
RDW: 14.7 % (ref 11.5–15.5)
WBC: 8 10*3/uL (ref 4.0–10.5)
nRBC: 0 % (ref 0.0–0.2)

## 2018-12-24 LAB — MAGNESIUM: Magnesium: 1.2 mg/dL — ABNORMAL LOW (ref 1.7–2.4)

## 2018-12-24 LAB — COMPREHENSIVE METABOLIC PANEL
ALT: 28 U/L (ref 0–44)
AST: 60 U/L — ABNORMAL HIGH (ref 15–41)
Albumin: 2.3 g/dL — ABNORMAL LOW (ref 3.5–5.0)
Alkaline Phosphatase: 128 U/L — ABNORMAL HIGH (ref 38–126)
Anion gap: 8 (ref 5–15)
BUN: 5 mg/dL — ABNORMAL LOW (ref 6–20)
CO2: 25 mmol/L (ref 22–32)
Calcium: 8.4 mg/dL — ABNORMAL LOW (ref 8.9–10.3)
Chloride: 104 mmol/L (ref 98–111)
Creatinine, Ser: 0.53 mg/dL — ABNORMAL LOW (ref 0.61–1.24)
GFR calc Af Amer: >60 mL/min (ref >60)
GFR calc non Af Amer: >60 mL/min (ref >60)
Glucose, Bld: 89 mg/dL (ref 70–99)
Potassium: 3.6 mmol/L (ref 3.5–5.1)
Sodium: 137 mmol/L (ref 135–145)
Total Bilirubin: 3.5 mg/dL — ABNORMAL HIGH (ref 0.3–1.2)
Total Protein: 6.6 g/dL (ref 6.5–8.1)

## 2018-12-24 LAB — RPR: RPR Ser Ql: NONREACTIVE

## 2018-12-24 LAB — GLUCOSE, CAPILLARY: Glucose-Capillary: 80 mg/dL (ref 70–99)

## 2018-12-24 LAB — HIV ANTIBODY (ROUTINE TESTING W REFLEX): HIV Screen 4th Generation wRfx: NONREACTIVE

## 2018-12-24 MED ORDER — GABAPENTIN 600 MG PO TABS
300.0000 mg | ORAL_TABLET | Freq: Three times a day (TID) | ORAL | Status: DC
  Administered 2018-12-24 – 2018-12-29 (×14): 300 mg via ORAL
  Filled 2018-12-24 (×14): qty 1

## 2018-12-24 MED ORDER — TRAZODONE HCL 50 MG PO TABS
50.0000 mg | ORAL_TABLET | Freq: Every day | ORAL | Status: DC
  Administered 2018-12-24 – 2018-12-28 (×5): 50 mg via ORAL
  Filled 2018-12-24 (×5): qty 1

## 2018-12-24 MED ORDER — LACTULOSE 10 GM/15ML PO SOLN
20.0000 g | Freq: Three times a day (TID) | ORAL | Status: DC
  Administered 2018-12-24 – 2018-12-29 (×15): 20 g via ORAL
  Filled 2018-12-24 (×15): qty 30

## 2018-12-24 MED ORDER — LORAZEPAM 1 MG PO TABS: 2.0000 mg | ORAL_TABLET | ORAL | Status: DC | PRN

## 2018-12-24 MED ORDER — LORAZEPAM 2 MG/ML IJ SOLN: 2.0000 mg | INTRAMUSCULAR | Status: DC | PRN

## 2018-12-24 NOTE — Consult Note (Addendum)
Telepsych Consultation   Reason for Consult:  "Patient admitted for alcohol widrawal, completing withdrawal. Now have been able to obtain collateral from family that he has episodes of being awake and active for times and periods of depression, concern for bipolar. Also has memory issues for several months." Referring Physician: Dr. Jessy Oto  Location of Patient: [facility] Location of Provider: [facility]  Patient Identification: Aleksa Catterton MRN:  [id number] Principal Diagnosis: Alcohol use disorder, severe, dependence (HCC) Diagnosis:  Principal Problem:   Alcohol use disorder, severe, dependence (HCC) Active Problems:   Seizure (HCC)   Alcohol withdrawal syndrome, with delirium (HCC)   Encephalopathy   Alcoholic cirrhosis (HCC)   Thrombocytopenia (HCC)   Total Time spent with patient: 1 hour  Subjective:   Donyea Beverlin is a 38 y.o. male patient admitted with alcohol withdrawal seizure.  HPI:   Per chart review, patient was admitted with alcohol withdrawal seizure. His hospital course has been complicated by encephalopathy and alcoholic cirrhosis with thrombocytopenia. He is prescribed scheduled Ativan. Collateral was obtained from his brother. He reports a history of heavy alcohol use. He was on his way to [place] to receive treatment. He has been having memory problems for the past 2-3 months. He has periods of "not sleeping, talking fast and not making sense." BAL and UDS were negative on admission.  Patient is Spanish speaking. Phone interpreter services were used to communicate with patient. On interview, Mr. Biello reports that he is admitted to the hospital for alcoholism. He reports heavy alcohol use for 10 years. He drinks up to 10 beers daily. He denies problems with severe withdrawals in the past. He last completed rehab 3 years ago. He was sober for over 3 years. He has taken medication for cravings in the past but he does not recall the name of the  medication. He is interested in alcohol abuse treatment. He denies illicit substance use. He admits to episodic depressed mood. He reports feeling disappointed due to his current situation. He has seen a provider for depression and has taken medication. He does not recall the name of the medication that he last took which was 3 years ago. It was helpful for his mood. He denies SI or a history of suicide attempts. He denies HI or AVH. He reports problems with insomnia. He sleeps 3-4 hours nightly. He denies problems with his appetite. He is oriented to self only. He believes the year is 2001 and the month is July. He believes that he is 38 y/o.    Past Psychiatric History: Depression   Risk to Self:  None. Denies SI. Risk to Others:  None. Denies HI.  Prior Inpatient Therapy:  Denies  Prior Outpatient Therapy:  Denies   Past Medical History:  Past Medical History:  Diagnosis Date  . Seizure [facility])     Past Surgical History:  Procedure Laterality Date  . finger tip injury repair Left    3rd finger   Family History:  Family History  Problem Relation Age of Onset  . Asthma Mother   . Asthma Brother    Family Psychiatric  History: Denies  Social History:  Social History   Substance and Sexual Activity  Alcohol Use Yes  . Alcohol/week: 70.0 standard drinks  . Types: 70 Cans of beer per week   Comment: 10-15 beer/day     Social History   Substance and Sexual Activity  Drug Use Never    Social History   Socioeconomic History  .  Marital status: Single    Spouse name: Not on file  . Number of children: Not on file  . Years of education: Not on file  . Highest education level: Not on file  Occupational History  . Not on file  Social Needs  . Financial resource strain: Not on file  . Food insecurity    Worry: Not on file    Inability: Not on file  . Transportation needs    Medical: Not on file    Non-medical: Not on file  Tobacco Use  . Smoking status: Never Smoker  .  Smokeless tobacco: Never Used  Substance and Sexual Activity  . Alcohol use: Yes    Alcohol/week: 70.0 standard drinks    Types: 70 Cans of beer per week    Comment: 10-15 beer/day  . Drug use: Never  . Sexual activity: Not on file  Lifestyle  . Physical activity    Days per week: Not on file    Minutes per session: Not on file  . Stress: Not on file  Relationships  . Social Musicianconnections    Talks on phone: Not on file    Gets together: Not on file    Attends religious service: Not on file    Active member of club or organization: Not on file    Attends meetings of clubs or organizations: Not on file    Relationship status: Not on file  Other Topics Concern  . Not on file  Social History Narrative  . Not on file   Additional Social History: He is from [place]. He lives with friends. He does not have children and he has not been married. He works in Social research officer, governmentgardening.      Allergies:  No Known Allergies  Labs:  Results for orders placed or performed during the hospital encounter of 12/18/18 (from the past 48 hour(s))  CBC     Status: Abnormal   Collection Time: 12/23/18  3:53 AM  Result Value Ref Range   WBC 7.0 4.0 - 10.5 K/uL   RBC 3.29 (L) 4.22 - 5.81 MIL/uL   Hemoglobin 11.1 (L) 13.0 - 17.0 g/dL   HCT 47.832.3 (L) 29.539.0 - 62.152.0 %   MCV 98.2 80.0 - 100.0 fL   MCH 33.7 26.0 - 34.0 pg   MCHC 34.4 30.0 - 36.0 g/dL   RDW 30.814.6 65.711.5 - 84.615.5 %   Platelets 105 (L) 150 - 400 K/uL    Comment: Immature Platelet Fraction may be clinically indicated, consider ordering this additional test [id number] CONSISTENT WITH PREVIOUS RESULT    nRBC 0.0 0.0 - 0.2 %    Comment: Performed at [facility], [address] [place], [place] [address]  Comprehensive metabolic panel     Status: Abnormal   Collection Time: 12/23/18  3:53 AM  Result Value Ref Range   Sodium 136 135 - 145 mmol/L   Potassium 4.0 3.5 - 5.1 mmol/L   Chloride 106 98 - 111 mmol/L   CO2 23 22 - 32 mmol/L   Glucose, Bld 95  70 - 99 mg/dL   BUN <5 (L) 6 - 20 mg/dL   Creatinine, Ser 0.100.48 (L) 0.61 - 1.24 mg/dL   Calcium 8.7 (L) 8.9 - 10.3 mg/dL   Total Protein 7.1 6.5 - 8.1 g/dL   Albumin 2.5 (L) 3.5 - 5.0 g/dL   AST 69 (H) 15 - 41 U/L   ALT 31 0 - 44 U/L   Alkaline Phosphatase 132 (H) 38 -  126 U/L   Total Bilirubin 4.1 (H) 0.3 - 1.2 mg/dL   GFR calc non Af Amer >60 >60 mL/min   GFR calc Af Amer >60 >60 mL/min   Anion gap 7 5 - 15    Comment: Performed at [facility], [address] [place], [place] [address]  Magnesium     Status: Abnormal   Collection Time: 12/23/18  3:53 AM  Result Value Ref Range   Magnesium 1.5 (L) 1.7 - 2.4 mg/dL    Comment: Performed at [facility], [address] [place], [place] [address]  Bilirubin, direct     Status: Abnormal   Collection Time: 12/23/18  3:53 AM  Result Value Ref Range   Bilirubin, Direct 2.3 (H) 0.0 - 0.2 mg/dL    Comment: Performed at [facility], [address] [place], [place] [address]  Phosphorus     Status: None   Collection Time: 12/23/18  3:53 AM  Result Value Ref Range   Phosphorus 3.7 2.5 - 4.6 mg/dL    Comment: Performed at [facility], [address] [place], [place] [address]  Glucose, capillary     Status: None   Collection Time: 12/23/18  7:44 AM  Result Value Ref Range   Glucose-Capillary 78 70 - 99 mg/dL  Vitamin [id number]     Status: Abnormal   Collection Time: 12/23/18  4:03 PM  Result Value Ref Range   Vitamin B-12 1,434 (H) 180 - 914 pg/mL    Comment: (NOTE) This assay is not validated for testing neonatal or myeloproliferative syndrome specimens for Vitamin B12 levels. Performed at [facility], [address] [place], [place] [address]   Comprehensive metabolic panel     Status: Abnormal   Collection Time: 12/24/18  7:34 AM  Result Value Ref Range   Sodium 137 135 - 145 mmol/L   Potassium 3.6 3.5 - 5.1 mmol/L   Chloride 104 98 - 111 mmol/L   CO2 25 22 - 32 mmol/L   Glucose, Bld 89 70 - 99  mg/dL   BUN 5 (L) 6 - 20 mg/dL   Creatinine, Ser 8.650.53 (L) 0.61 - 1.24 mg/dL   Calcium 8.4 (L) 8.9 - 10.3 mg/dL   Total Protein 6.6 6.5 - 8.1 g/dL   Albumin 2.3 (L) 3.5 - 5.0 g/dL   AST 60 (H) 15 - 41 U/L   ALT 28 0 - 44 U/L   Alkaline Phosphatase 128 (H) 38 - 126 U/L   Total Bilirubin 3.5 (H) 0.3 - 1.2 mg/dL   GFR calc non Af Amer >60 >60 mL/min   GFR calc Af Amer >60 >60 mL/min   Anion gap 8 5 - 15    Comment: Performed at [facility], [address] [place], [place] [address]  Magnesium     Status: Abnormal   Collection Time: 12/24/18  7:34 AM  Result Value Ref Range   Magnesium 1.2 (L) 1.7 - 2.4 mg/dL    Comment: Performed at [facility], [address] [place], [place] [address]  CBC     Status: Abnormal   Collection Time: 12/24/18  7:34 AM  Result Value Ref Range   WBC 8.0 4.0 - 10.5 K/uL   RBC 3.33 (L) 4.22 - 5.81 MIL/uL   Hemoglobin 11.4 (L) 13.0 - 17.0 g/dL   HCT 84.132.6 (L) 32.439.0 - 40.152.0 %   MCV 97.9 80.0 - 100.0 fL  MCH 34.2 (H) 26.0 - 34.0 pg   MCHC 35.0 30.0 - 36.0 g/dL   RDW 16.114.7 09.611.5 - 04.515.5 %   Platelets 152 150 - 400 K/uL   nRBC 0.0 0.0 - 0.2 %    Comment: Performed at [facility], [address] [place], [place] [address]  Glucose, capillary     Status: None   Collection Time: 12/24/18  8:06 AM  Result Value Ref Range   Glucose-Capillary 80 70 - 99 mg/dL    Medications:  Current Facility-Administered Medications  Medication Dose Route Frequency Provider Last Rate Last Dose  . 0.9 %  sodium chloride infusion   Intravenous Continuous Inez CatalinaMullen, Emily B, MD 75 mL/hr at 12/24/18 [phone]    . folic acid (FOLVITE) tablet 1 mg  1 mg Oral Daily Debe CoderMullen, Emily B, MD   1 mg at 12/24/18 [phone]  . haloperidol lactate (HALDOL) injection 2 mg  2 mg Intramuscular Q8H PRN Thom ChimesJones, Ellen, MD   2 mg at 12/21/18 2120   Or  . haloperidol (HALDOL) tablet 2 mg  2 mg Oral Q8H PRN Thom ChimesJones, Ellen, MD      . ibuprofen (ADVIL) tablet 400 mg  400 mg Oral Q6H PRN Inez CatalinaMullen,  Emily B, MD   400 mg at 12/22/18 1341  . LORazepam (ATIVAN) tablet 2 mg  2 mg Oral Q4H PRN Kirt BoysAlexander, Andrew, MD       Or  . LORazepam (ATIVAN) injection 2 mg  2 mg Intramuscular Q4H PRN Kirt BoysAlexander, Andrew, MD      . multivitamin with minerals tablet 1 tablet  1 tablet Oral Daily Inez CatalinaMullen, Emily B, MD   1 tablet at 12/24/18 [phone]  . thiamine 500mg  in normal saline (50ml) IVPB  500 mg Intravenous TID Kirt BoysAlexander, Andrew, MD 100 mL/hr at 12/24/18 0816 500 mg at 12/24/18 0816   Followed by  . [START ON 12/25/2018] thiamine (B-1) 250 mg in sodium chloride 0.9 % 50 mL IVPB  250 mg Intravenous Daily Kirt BoysAlexander, Andrew, MD        Musculoskeletal: Strength & Muscle Tone: No atrophy noted. Gait & Station: UTA due to patient lying in bed. Patient leans: N/A  Psychiatric Specialty Exam: Physical Exam  Nursing note and vitals reviewed. Constitutional: He appears well-developed and well-nourished.  HENT:  Head: Normocephalic and atraumatic.  Neck: Normal range of motion.  Respiratory: Effort normal.  Musculoskeletal: Normal range of motion.  Neurological: He is alert.  Oriented to person, place and month.  Psychiatric: He has a normal mood and affect. His speech is normal and behavior is normal. Judgment and thought content normal. Cognition and memory are normal.    Review of Systems  Cardiovascular: Negative for chest pain.  Gastrointestinal: Negative for abdominal pain, constipation, diarrhea, nausea and vomiting.  Neurological: Positive for tremors.  All other systems reviewed and are negative.   Blood pressure 113/72, pulse 91, temperature 99.3 F (37.4 C), temperature source Oral, resp. rate 18, height 6' (1.829 m), weight 70.3 kg, SpO2 99 %.Body mass index is 21.02 kg/m.  General Appearance: Fairly Groomed, young Hispanic male, wearing a hospital gown who is lying in bed. NAD.   Eye Contact:  Good  Speech:  Clear and Coherent and Normal Rate  Volume:  Normal  Mood:  Dysphoric  Affect:   Congruent  Thought Process:  Goal Directed, Linear and Descriptions of Associations: Intact  Orientation:  Other:  Oriented to person, place and month.  Thought Content:  Logical  Suicidal Thoughts:  No  Homicidal Thoughts:  No  Memory:  Immediate;   Good Recent;   Good Remote;   Good  Judgement:  Fair  Insight:  Fair  Psychomotor Activity:  Normal  Concentration:  Concentration: Good and Attention Span: Good  Recall:  Good  Fund of Knowledge:  Good  Language:  Good  Akathisia:  No  Handed:  Right  AIMS (if indicated):   N/A  Assets:  Communication Skills Desire for Improvement Financial Resources/Insurance Housing Physical Health Resilience Social Support  ADL's:  Intact  Cognition:  Impaired with short and long term memory deficits.   Sleep:   Poor   Assessment:  Rowdy Guerrini is a 38 y.o. male who was admitted with alcohol withdrawal seizure. His hospital course has been complicated by encephalopathy and alcoholic cirrhosis with thrombocytopenia. He reports chronic and heavy alcohol use. He would like resources for substance abuse treatment. Recommend Gabapentin for alcohol use/cravings and Trazodone for sleep. He denies SI, HI or AVH. He does not warrant inpatient psychiatric hospitalization at this time. Patient has deficits in short and long term memory which may be attributed to chronic alcohol use. He may benefit from formal neuropsychological testing. He is able to state what treatment he is currently receiving in the hospital and appropriately answer questions but he may require assistance with making more complex medical decisions in the future due to memory impairment.   Treatment Plan Summary: -Start Gabapentin 300 mg TID for alcohol use/cravings.  -Start Trazodone 50 mg qhs PRN for insomnia.  -EKG reviewed and QTc 461 on 7/1. Please closely monitor when starting or increasing QTc prolonging agents.  -Please have SW provide patient with resources for substance abuse  treatment.  -Psychiatry will sign off on patient at this time. Please consult psychiatry again as needed.    Disposition: No evidence of imminent risk to self or others at present.   Patient does not meet criteria for psychiatric inpatient admission.  This service was provided via telemedicine using a 2-way, interactive audio and video technology.  Names of all persons participating in this telemedicine service and their role in this encounter. Name: Buford Dresser, DO Role: Psychiatrist   Name: Tama Gander Role: Patient    Faythe Dingwall, DO 12/24/2018 1:47 PM

## 2018-12-24 NOTE — Progress Notes (Signed)
Subjective: Spanish Translator translates conversation by phone. The patient reports felling well and experienced no anxiety overnight. He is calm and cooperative throught interview and exam.The team shared with pt that they spoke to his brother. The patient says he was in the hospital 3 weeks ago. He says he had a lot of pressure at work and felt like he needed to admit himself. He say he now wants to go back to [place] to see his parents. Team explains we are giving him IV fluids and vitamins. He says he appreciates this and ask when will he be okay to leave. Team shares need to continue treatment and monitor his condition.   Patient is asked is he in the hospital or at home. He believes he is in a house of some sort . He is told [place]. His story of getting here is retold. He can not answer the year correctly , but knows it is summer time.   Objective:  Vital signs in last 24 hours: Vitals:   12/23/18 1443 12/23/18 2337 12/24/18 0631 12/24/18 0808  BP: 107/78 112/80 102/74 109/76  Pulse: 71 83 80 79  Resp: 16 17 17 18   Temp: 98.1 F (36.7 C) 98.6 F (37 C) 98.9 F (37.2 C) 99 F (37.2 C)  TempSrc: Oral   Oral  SpO2: 99% 100% 100% 99%  Weight:      Height:       Physical Exam Vitals signs reviewed.  Constitutional:      General: He is not in acute distress.    Appearance: Normal appearance. He is normal weight. He is not ill-appearing, toxic-appearing or diaphoretic.  HENT:     Head: Normocephalic and atraumatic.  Eyes:     General: Scleral icterus present.     Extraocular Movements: Extraocular movements intact.     Pupils: Pupils are equal, round, and reactive to light.  Cardiovascular:     Rate and Rhythm: Normal rate and regular rhythm.     Pulses: Normal pulses.     Heart sounds: Normal heart sounds. No murmur. No friction rub. No gallop.   Pulmonary:     Effort: Pulmonary effort is normal. No respiratory distress.     Breath sounds:  Normal breath sounds. No wheezing or rales.  Abdominal:     General: Abdomen is flat. There is no distension.     Palpations: Abdomen is soft.     Tenderness: There is no abdominal tenderness. There is no right CVA tenderness or guarding.  Musculoskeletal:        General: No swelling or deformity.  Skin:    General: Skin is warm and dry.     Coloration: Skin is jaundiced.  Neurological:     Mental Status: He is alert.     Comments: Alert but not oriented to place or year. Says he thinks he is at home and the year is 2003.   Psychiatric:        Mood and Affect: Mood normal.     Comments: Calm and pleasant through exam     Assessment/Plan:  Principal Problem:   Alcohol withdrawal syndrome, with delirium (HCC) Active Problems:   Seizure (HCC)   Encephalopathy   Alcoholic cirrhosis (HCC)   Thrombocytopenia (HCC)  Alcohol withdrawal syndrome, with delirium (HCC)  In summary,James Huff is a 38 y.o male with a history of alcohol withdrawal related seizures who presentedon 7/1aftera syncopal event in the context of significantly elevated LFTs,  thrombocytopenia, loss of bowel and bladder function, tongue laceration, tremors, and confusion, and improvement of symptoms after Ativan administration.These findings are indicative of alcohol withdrawal seizures.   Of note,(7/6) I had a 30+ minute discussion with the patient's brother, James Huff. In summary James Huff has been having major memory lapses for the last 2-3 months. He frequently repeats himself because he forgets what he says. Questionable psychiatric hx, as James Huff says his brother hasn't slept in 2 weeks prior to hospitalization and has episodes where he is extremely talkative and doesn't make sense. Has been drinking heavily and is in denial about his alcoholism.Please refer to the progress note for the full details of that discussion.   #Transaminitis #Alcohol Withdrawal #Encephlopahty # Withdrawal Seizures: Patient reports  drinking at least 12 beers per day PTA. LFTs consistent with alcohol use, and finding of cirrhosis on ultrasound. Hislast drink was roughly 2 daysbefore admission. Patient had a seizure prior to admission, but has not had any further seizures. Patient remains confused after several days of withdrawal,concerning for Wernicke's Encephalopathy.Patient is IVC'd due to risk of harm to himself. His agitation has improved, as restraints are removed.   The patient remains confused and is only oriented to himself. Doesn't know where he is or the year despite [place] telling him every day. We are out of the window for withdrawal and at this point it is concerning that he has permanent memory damage. It is doubtful that he has hepatic encephalopathy, but we could try lactulose to see if there may be improvement. No asterixis on exam. Other causes of encephalopathy being investigated as well.   - Vitamin B1 levelWNL - Increased thiamine dose to 500mg  TID for 2 days followed by 250mg  daily for 5 days. (day 1/5) - Continue IVF - Finished 4 day Librium taper  - CIWA w/ Ativan PRN. Improved - Haldol PRN, Aggitation - PRN Ativan for seizures  - Continue Folic acid - Start lactuose 20mg  TID, titrate to 3 BM a day.  - f/u RPR, HIV, syphilis, and B12. Consider LP next if no findings - MELD score 16, indicating 6% mortality in next 3 months, if able to stay sober, will probably need hepatologist evaluation for consideration of transplanted liver.  #Thrombocytopenia: Likely due to excessive alcohol consumption. Platelet count 29 on admission. Platelet count continues to uptrend(152) -Daily CBCs  #Questionable psych history bipolar disorder? - I was able to talk to the patient's brother and noted the patient was up for 15 days straight without sleeping. He also has episodes of talking very fast and not making sense. - Collateral from brother - Psych consulted, will appreciate recs  #HyponatremiaRESOLVED -  131 on admission>137 #Hypomangnesemia #Hypokalemia - 3.4today -PO KCl - Will check magnesium level in the AM  Dispo: Anticipated discharge unknown at this time. Brother says he is working on finding someone pick Mr. Noack up. James Huff (Brother) phone: [phone]

## 2018-12-25 LAB — GLUCOSE, CAPILLARY: Glucose-Capillary: 77 mg/dL (ref 70–99)

## 2018-12-25 NOTE — Progress Notes (Addendum)
Subjective: Pt seen on rounds this AM. Spanish interpreter available over the phone. Eating breakfast. The patient says he feels better today. He slept well last night. Mood appears better, smiles and laughs frequently. Says he knows he has a problem with alcohol.  Objective:  Vital signs in last 24 hours: Vitals:   12/24/18 1809 12/24/18 2232 12/25/18 0516 12/25/18 1426  BP: 114/87 111/79 106/68 111/86  Pulse: 95 75 76 100  Resp: 16 18 18 18   Temp: 98.5 F (36.9 C) 98 F (36.7 C) 98 F (36.7 C) 98.4 F (36.9 C)  TempSrc: Oral Oral Oral Oral  SpO2: 97% 97% 97% 98%  Weight:      Height:       Physical Exam Constitutional:      General: He is not in acute distress.    Appearance: Normal appearance. He is not toxic-appearing or diaphoretic.  HENT:     Head: Normocephalic and atraumatic.  Eyes:     General: Scleral icterus present.     Extraocular Movements: Extraocular movements intact.     Pupils: Pupils are equal, round, and reactive to light.  Cardiovascular:     Rate and Rhythm: Normal rate and regular rhythm.     Pulses: Normal pulses.     Heart sounds: Normal heart sounds. No murmur. No friction rub. No gallop.   Pulmonary:     Effort: Pulmonary effort is normal. No respiratory distress.     Breath sounds: Normal breath sounds. No wheezing or rales.  Abdominal:     General: Abdomen is flat. Bowel sounds are normal. There is no distension.     Palpations: Abdomen is soft.     Tenderness: There is no abdominal tenderness. There is no guarding.  Musculoskeletal:        General: No swelling or deformity.     Right lower leg: No edema.     Left lower leg: No edema.  Skin:    General: Skin is warm and dry.  Neurological:     Mental Status: He is alert. He is disoriented.     Cranial Nerves: No cranial nerve deficit.     Sensory: No sensory deficit.     Motor: No weakness.     Comments: Oriented x1. Patient says he is in a rehab facility and thinks the year is  2001. Says he is 38 years old (he's 38)  Psychiatric:        Mood and Affect: Mood normal.        Behavior: Behavior normal.    Assessment/Plan:  Principal Problem:   Alcohol use disorder, severe, dependence (HCC) Active Problems:   Seizure (HCC)   Alcohol withdrawal syndrome, with delirium ([place])   Encephalopathy   Alcoholic cirrhosis (HCC)   Thrombocytopenia ([place])  In summary,Mr. Dubree is a 38 y.o male with a history of alcohol withdrawal related seizures who presentedon 7/1aftera syncopal event in the context of significantly elevated LFTs, thrombocytopenia, loss of bowel and bladder function, tongue laceration, tremors, and confusion, and improvement of symptoms after Ativan administration.These findings are indicative of alcohol withdrawal seizures.  Of note,(7/6) I had a 30+ minute discussion with the patient's brother, Cassandria Santee. In summary Mr. Bayon has been having major memory lapses for the last 2-3 months. He frequently repeats himself because he forgets what he says. Questionable psychiatric hx, as Cassandria Santee says his brother hasn't slept in 2 weeks prior to hospitalization and has episodes where he is extremely talkative and doesn't make sense. Has  been drinking heavily and is in denial about his alcoholism.Please refer to the progress note for the full details of that discussion.   #Transaminitis #Alcohol Withdrawal #Encephlopahty # Withdrawal Seizures: Patient reports drinking at least 12 beers per day PTA. LFTs consistent with alcohol use, and finding of cirrhosis on ultrasound. Hislast drink was roughly 2 daysbefore admission. Patient had a seizure prior to admission, but has not had any further seizures. Patient remains confused after several days of withdrawal,concerning for Wernicke's Encephalopathy.Patient is IVC'd due to risk of harm to himself.His agitation has improved, as restraints are removed.   The patient remains confused and is only oriented to himself.  Doesn't know where he is or the year despite us telling him every day. We are out of the window for withdrawal and at this point it is concerning that he has permanent memory damage. It is doubtful that he has hepatic encephalopathy, but we could try lactulose to see if there may be improvement. No asterixis on exam. Other causes of encephalopathy being investigated as well.  - Vitamin B1 levelWNL -Increased thiamine dose to 500mg  TID for 2 days followed by 250mg  daily for 5 days - Continue IVF - Finished 4 day Librium taper  - CIWA w/ Ativan PRN - Haldol PRN, Aggitation - Continue Folic acid - Lactuose 20mg  TID, titrate to 3 BM a day.  - RPR & HIV negative. B12 normal. Considering LP - MELD score 16, indicating 6% mortality in next 3 months, if able to stay sober, will probably need hepatologist evaluation for consideration of transplanted liver.  #Thrombocytopenia: Likely due to excessive alcohol consumption. Platelet count 29 on admission. Platelet count continues to uptrend(152) -Daily CBCs  #Questionable psych history bipolar disorder vs schizophrenia vs. Substance induced psychosis?- I was able to talk to the patient's brother and noted the patient was up for 15 days straight without sleeping. He also has episodes of talking very fast and not making sense. Today, the brother says he noticed Mr.Leinbach has been seeing things that aren't there and hearing voices for the last 2-3 months. He says this was not the case before, and seem to have coincided with increased EtOH consumption.  - Collateral from brother - Psych consulted, recs are as follows:  - Gabapentin 300 TID for alcohol cravings  - Trazadone 50mg  qhs PRN for sleep issues  FEN/GI #Hyponatremia: Resolved #Hypokalemia: Resloved #Hypomangnesemia: 1.2 on last level yesterday - BMP tomorrow   Dispo: Anticipated discharge unknown at this time. Brother says he is working on finding someone pick Mr. Postema up. Jonetta Speak- Luis  (Brother) phone: [phone]  Kirt BoysAlexander, Eris Hannan, MD 12/25/2018, 3:23 PM Pager: [phone]

## 2018-12-26 LAB — COMPREHENSIVE METABOLIC PANEL
ALT: 25 U/L (ref 0–44)
AST: 54 U/L — ABNORMAL HIGH (ref 15–41)
Albumin: 2.3 g/dL — ABNORMAL LOW (ref 3.5–5.0)
Alkaline Phosphatase: 119 U/L (ref 38–126)
Anion gap: 10 (ref 5–15)
BUN: <5 mg/dL — ABNORMAL LOW (ref 6–20)
CO2: 26 mmol/L (ref 22–32)
Calcium: 8.3 mg/dL — ABNORMAL LOW (ref 8.9–10.3)
Chloride: 99 mmol/L (ref 98–111)
Creatinine, Ser: 0.65 mg/dL (ref 0.61–1.24)
GFR calc Af Amer: >60 mL/min (ref >60)
GFR calc non Af Amer: >60 mL/min (ref >60)
Glucose, Bld: 110 mg/dL — ABNORMAL HIGH (ref 70–99)
Potassium: 3.3 mmol/L — ABNORMAL LOW (ref 3.5–5.1)
Sodium: 135 mmol/L (ref 135–145)
Total Bilirubin: 2.9 mg/dL — ABNORMAL HIGH (ref 0.3–1.2)
Total Protein: 6.3 g/dL — ABNORMAL LOW (ref 6.5–8.1)

## 2018-12-26 LAB — CBC
HCT: 31.2 % — ABNORMAL LOW (ref 39.0–52.0)
Hemoglobin: 10.9 g/dL — ABNORMAL LOW (ref 13.0–17.0)
MCH: 34 pg (ref 26.0–34.0)
MCHC: 34.9 g/dL (ref 30.0–36.0)
MCV: 97.2 fL (ref 80.0–100.0)
Platelets: 198 10*3/uL (ref 150–400)
RBC: 3.21 MIL/uL — ABNORMAL LOW (ref 4.22–5.81)
RDW: 15 % (ref 11.5–15.5)
WBC: 11.7 10*3/uL — ABNORMAL HIGH (ref 4.0–10.5)
nRBC: 0 % (ref 0.0–0.2)

## 2018-12-26 LAB — GLUCOSE, CAPILLARY
Glucose-Capillary: 100 mg/dL — ABNORMAL HIGH (ref 70–99)
Glucose-Capillary: 102 mg/dL — ABNORMAL HIGH (ref 70–99)

## 2018-12-26 LAB — MAGNESIUM: Magnesium: 1.3 mg/dL — ABNORMAL LOW (ref 1.7–2.4)

## 2018-12-26 MED ORDER — LORAZEPAM 2 MG/ML IJ SOLN: 2.0000 mg | Freq: Three times a day (TID) | INTRAMUSCULAR | Status: DC | PRN

## 2018-12-26 MED ORDER — MAGNESIUM SULFATE 2 GM/50ML IV SOLN
2.0000 g | Freq: Once | INTRAVENOUS | Status: AC
  Administered 2018-12-26: 2 g via INTRAVENOUS
  Filled 2018-12-26: qty 50

## 2018-12-26 MED ORDER — POTASSIUM CHLORIDE CRYS ER 20 MEQ PO TBCR
40.0000 meq | EXTENDED_RELEASE_TABLET | Freq: Once | ORAL | Status: AC
  Administered 2018-12-26: 40 meq via ORAL
  Filled 2018-12-26: qty 2

## 2018-12-26 MED ORDER — LORAZEPAM 1 MG PO TABS: 2.0000 mg | ORAL_TABLET | Freq: Three times a day (TID) | ORAL | Status: DC | PRN

## 2018-12-26 NOTE — Progress Notes (Signed)
Chart reviewed; CM/SW following for safe discharge; Home Supervisor [phone]

## 2018-12-26 NOTE — Progress Notes (Addendum)
Subjective:  James Huff, structuralpanish translator used by phone. Patient sitting in chair on exam. He is eating and reports feeling well. He has a cell phone and will call his brother today to coordinated steps going forward. Had 2 bowel movements yesterday.  Objective:  Vital signs in last 24 hours: Vitals:   12/24/18 2232 12/25/18 0516 12/25/18 1426 12/26/18 0520  BP: 111/79 106/68 111/86 105/72  Pulse: 75 76 100 84  Resp: 18 18 18 13   Temp: 98 F (36.7 C) 98 F (36.7 C) 98.4 F (36.9 C) 98.3 F (36.8 C)  TempSrc: Oral Oral Oral Oral  SpO2: 97% 97% 98% 99%  Weight:      Height:       Physical Exam Constitutional:      General: He is not in acute distress.    Appearance: Normal appearance. He is normal weight. He is not ill-appearing, toxic-appearing or diaphoretic.  HENT:     Head: Normocephalic and atraumatic.     Mouth/Throat:     Mouth: Mucous membranes are moist.  Eyes:     General: Scleral icterus present.        Right eye: No discharge.        Left eye: No discharge.     Extraocular Movements: Extraocular movements intact.  Cardiovascular:     Rate and Rhythm: Normal rate and regular rhythm.     Pulses: Normal pulses.     Heart sounds: Normal heart sounds. No murmur. No friction rub. No gallop.   Pulmonary:     Effort: Pulmonary effort is normal. No respiratory distress.     Breath sounds: Normal breath sounds. No wheezing or rales.  Abdominal:     General: Abdomen is flat. There is no distension.     Palpations: Abdomen is soft.     Tenderness: There is no abdominal tenderness. There is no guarding.  Musculoskeletal:        General: No swelling or deformity.  Skin:    General: Skin is warm.  Neurological:     Mental Status: He is alert. He is disoriented.     Coordination: Coordination normal.     Comments: Doesn't remember location (Thinks [place]'s in [place] Remembers birthday and age.  Negative Romberg   Psychiatric:        Mood and Affect: Mood normal.      Behavior: Behavior normal.    Assessment/Plan:  Principal Problem:   Alcohol use disorder, severe, dependence (HCC) Active Problems:   Seizure (HCC)   Alcohol withdrawal syndrome, with delirium (HCC)   Encephalopathy   Alcoholic cirrhosis (HCC)   Thrombocytopenia (HCC)   In summary, James Huff is a 38 y.o male with a history of alcohol use disorder admitted on 7/1 after seizures, likely due to alcohol withdrawal.    Of note, (7/6) I had a 30+ minute discussion with the patient's brother, James Huff. In summary James Huff has been having major memory lapses for the last 2-3 months. He frequently repeats himself because he forgets what he says. Questionable psychiatric hx, as James Huff says his brother hasn't slept in 2 weeks prior to hospitalization and has episodes where he is extremely talkative and doesn't make sense. Has been drinking heavily and is in denial about his alcoholism. Brother says he also has been seeing things that aren't there and hearing voices for the last 2-3 months. He says this was not the case before, and seem to have coincided with increased EtOH consumption.   #Transaminitis #Alcohol Withdrawal #Encephalopathy #  Withdrawal Seizures: Patient reports drinking at least 12 beers per day PTA. LFTs on presentation concerning for cirrhosis vs alcoholic hepatitis, and finding of cirrhosis on ultrasound. His last drink was roughly 2 days before admission. Patient had a seizure prior to admission, but has not had any further seizures.   Patient remains confused after resolution of withdrawal, concerning for Wernicke's Encephalopathy, Korsakoff syndrome, or hepatic encephalopathy. Doesn't know where he is or the year despite [place] telling him every day. Patient is IVC'd due to risk of harm to himself. We are treating empirically with lactulose for possible hepatic encephalopathy. No asterixis on exam.  - Vitamin B1 level WNL  - Increased thiamine dose to 500mg  TID for 2 days (done)  followed by 250mg  daily for 5 days (2/5) - Can discontinue IVF (eating and drinking well) - Finished 4 day Librium taper  - CIWA q12 w/ Ativan PRN - Haldol PRN, Agitation - Continue Folic acid - Lactuose 20mg  TID, titrate to 3 BM a day.  - RPR & HIV negative. B12 normal.  - MELD score 16, indicating 6% mortality in next 3 months, if able to stay sober, will probably need hepatologist evaluation for consideration of transplanted liver.    #Thrombocytopenia: Likely due to excessive alcohol consumption. Platelet count 29 on admission. Platelet count continues to improve (198) - every other day CBC   #Possible bipolar disorder vs schizophrenia vs. substance induced psychosis - I was able to talk to the patient's brother and noted the patient was up for 15 days straight without sleeping. He also has episodes of talking very fast and not making sense. Today, the brother says he noticed James Huff has been seeing things that aren't there and hearing voices for the last 2-3 months. He says this was not the case before, and seem to have coincided with increased EtOH consumption.  - Psych consulted, recs are as follows:             - Gabapentin 300 TID for alcohol cravings             - Trazadone 50mg  qhs PRN for sleep issues  - No antipsychotics or mood stabilizers recommended    FEN/GI #Hyponatremia: Resolved #Hypokalemia: Replete as needed #Hypomangnesemia: Will continue repletion   Dispo: Anticipated discharge this weekend.  - Brother says he is working on finding someone pick James Huff up. - CM looking into alcohol treatment facilities in [place] (Brother) phone: [phone]   James Plater, MD Internal Medicine, PGY1 Pager: [phone]  12/26/2018,7:31 AM

## 2018-12-26 NOTE — Plan of Care (Signed)

## 2018-12-27 LAB — CBC
HCT: 30.6 % — ABNORMAL LOW (ref 39.0–52.0)
Hemoglobin: 10.6 g/dL — ABNORMAL LOW (ref 13.0–17.0)
MCH: 33.7 pg (ref 26.0–34.0)
MCHC: 34.6 g/dL (ref 30.0–36.0)
MCV: 97.1 fL (ref 80.0–100.0)
Platelets: 237 10*3/uL (ref 150–400)
RBC: 3.15 MIL/uL — ABNORMAL LOW (ref 4.22–5.81)
RDW: 15 % (ref 11.5–15.5)
WBC: 13.6 10*3/uL — ABNORMAL HIGH (ref 4.0–10.5)
nRBC: 0 % (ref 0.0–0.2)

## 2018-12-27 LAB — COMPREHENSIVE METABOLIC PANEL
ALT: 25 U/L (ref 0–44)
AST: 58 U/L — ABNORMAL HIGH (ref 15–41)
Albumin: 2.3 g/dL — ABNORMAL LOW (ref 3.5–5.0)
Alkaline Phosphatase: 138 U/L — ABNORMAL HIGH (ref 38–126)
Anion gap: 8 (ref 5–15)
BUN: <5 mg/dL — ABNORMAL LOW (ref 6–20)
CO2: 28 mmol/L (ref 22–32)
Calcium: 8.5 mg/dL — ABNORMAL LOW (ref 8.9–10.3)
Chloride: 101 mmol/L (ref 98–111)
Creatinine, Ser: 0.64 mg/dL (ref 0.61–1.24)
GFR calc Af Amer: >60 mL/min (ref >60)
GFR calc non Af Amer: >60 mL/min (ref >60)
Glucose, Bld: 98 mg/dL (ref 70–99)
Potassium: 3.7 mmol/L (ref 3.5–5.1)
Sodium: 137 mmol/L (ref 135–145)
Total Bilirubin: 3.1 mg/dL — ABNORMAL HIGH (ref 0.3–1.2)
Total Protein: 6.8 g/dL (ref 6.5–8.1)

## 2018-12-27 LAB — GLUCOSE, CAPILLARY: Glucose-Capillary: 87 mg/dL (ref 70–99)

## 2018-12-27 LAB — MAGNESIUM: Magnesium: 1.5 mg/dL — ABNORMAL LOW (ref 1.7–2.4)

## 2018-12-27 MED ORDER — MAGNESIUM SULFATE 2 GM/50ML IV SOLN
2.0000 g | Freq: Once | INTRAVENOUS | Status: AC
  Administered 2018-12-27: 2 g via INTRAVENOUS
  Filled 2018-12-27: qty 50

## 2018-12-27 NOTE — Plan of Care (Signed)
  Problem: Education: Goal: Knowledge of General Education information will improve Description: Including pain rating scale, medication(s)/side effects and non-pharmacologic comfort measures Outcome: Progressing   Problem: Health Behavior/Discharge Planning: Goal: Ability to manage health-related needs will improve Outcome: Progressing   Problem: Clinical Measurements: Goal: Ability to maintain clinical measurements within normal limits will improve Outcome: Progressing Goal: Will remain free from infection Outcome: Progressing Goal: Diagnostic test results will improve Outcome: Progressing Goal: Cardiovascular complication will be avoided Outcome: Progressing   Problem: Activity: Goal: Risk for activity intolerance will decrease Outcome: Progressing   Problem: Nutrition: Goal: Adequate nutrition will be maintained Outcome: Progressing   Problem: Coping: Goal: Level of anxiety will decrease Outcome: Progressing   Problem: Safety: Goal: Ability to remain free from injury will improve Outcome: Progressing

## 2018-12-27 NOTE — Discharge Summary (Addendum)
Name: James Huff MRN: [id number] Date of birth: 08/25/1980 38 years old. PCP: patient, no PCP for  Admission Date: 12/18/2018 8:30 a.m. Discharge Date: 12/29/18 Treating physician: Oda Kilts, MD  Discharge diagnosis: 1. Alcoholic hepatitis / encephalopathy / alcohol withdrawal seizures 2. thrombocytopenia 3. Bipolar disorder / schizophrenia / substance-induced psychosis  Discharge medications: -Trazodone 50 mg oral tablet, take 1 tablet orally at bedtime -Lactulose 30 ml (20 g total) orally 3 times a day -Gabapentin 300 mg orally 3 times a day -Folic acid 1 mg tablet take 1 dose daily  Disposition and follow-up: James Huff was released from the [facility] in good condition. At the hospital follow-up visit please address the following:  #Alcoholic hepatitis and cirrhosis #Hepatic encephalopathy # Alcohol withdrawal seizures: Patient was traveling from [place] to [place] on a bus when he had a seizure at a bus stop in [place]. Subsequently, he was admitted and taken to the hospital and stabilized with antiepileptics, high doses of thiamine, lactulose, IVF, and electrolytes. However, self- and place-oriented after discharge, he says the year is 2002. There are still severe cognitive deficits despite having been eliminated for several days from alcohol withdrawal seizures.  # Thrombocytopenia: secondary to excessive alcohol consumption. Initially presented with 29, but with an upward trend and 237 upward.  # Bipolar disorder versus schizophrenia versus substance-induced psychosis: The patient had not slept for 2 consecutive weeks before hospitalization. Supposedly he has episodes of talking very fast and not making any sense. James Huff points out that for the past 2-3 months he hears and sees things that are not really there. These new hallucinations have coincided with increased alcohol consumption. He has not hallucinated while in the  hospital. The psychiatry evaluated did not recommend antipsychotic medications.  2. Laboratories / images required at the time of follow-up: CBC, CMP, Magnesium  3. Pending labs / tests needing follow-up: none  Follow-up appointments:   It is essential to establish care with a primary care provider. Make a follow-up appointment as soon as possible.  Hospital course by list of problems:  # Alcoholic hepatitis # Encephalopathy # Alcohol withdrawal seizures: Patient reported drinking at least 12 beers per day PTA. Her last drink before hospitalization was 2 days before. LFT in presentation on cirrhosis versus alcoholic hepatitis and finding of cirrhosis on ultrasound. His last drink was approximately 2 days before admission. The patient had a seizure before admission at the bus stop. They gave him several rounds of Ativan, which resulted in the resolution of the seizures. Upon entering the internal medicine floor, the patient was agitated and tried to leave AMA on several occasions. They gave him Haldol and Ativan for agitation and put gentle restraints on him. After a few days, the patient had less agitation, but was still significantly confused, but constantly failed the guiding questions. He was self-oriented, but forgot where he was despite the correction every day during his stay. He says we are in [place] every day until discharge. He also said that he was 38 years old several times, he did not remember why he was in the hospital or the current year. These memory failures persisted until the day of discharge despite resolution of withdrawal symptoms.  This severe confusion and memory lapses are of concern for Warnicke's encephalopathy, Korsakoff syndrome, or hepatic encephalopathy. We treated empirically with a 4-day Librium taper, lactulose, high doses of thiamine, IV fluids, replacement of electrolytes and folic acid. Other causes of encephalopathy were also  investigated. HIV and RPR were  negative. B12 was normal. The MELD score is 16, indicating 6% mortality in the next 3 months. If the patient can stay sober, he will need a liver evaluation to consider transplant.  # Thrombocytopenia: Due to excessive alcohol consumption, platelets were initially 29 in the presentation. Your platelet count increases every day at admission. On the day of discharge was 237.  # Likely Bipolar Disorder Versus Schizophrenia Versus Substance-Induced Psychosis: After subsequent conversations with the patient's brother, Jonetta SpeakLuis, it was discovered that the patient had been away for 15 straight days without sleep prior to admission. He also had episodes of talking extremely fast and making no sense. Her brother also notes that the patient had been seeing things that were not there and heard voices for 2-3 months before admission. This is not something that has happened before, and seems to have coincided with increased alcohol consumption. The patient says that he has not hallucinated since entering the hospital. Psychiatry was consulted upon discovering this story. They recommended gabapentin and trazodone for alcohol cravings and sleep disorders, respectively. They did not recommend antipsychotic treatments at this time.   Vital signs: BP 109/74   Pulse 84   Temperature 99.7  F (37.6  C) (Oral)   Resp. 17   Height 6 '(1,829 m)   Weight 70.3 kg   SpO2 96%   BMI 21.02 kg / m  Relevant laboratories, studies and procedures: CBC    Component Value Date/Time   WBC 13.6 (H) 12/27/2018 0241   RBC 3.15 (L) 12/27/2018 0241   HGB 10.6 (L) 12/27/2018 0241   HCT 30.6 (L) 12/27/2018 0241   PLT 237 12/27/2018 0241   MCV 97.1 12/27/2018 0241   MCH 33.7 12/27/2018 0241   MCHC 34.6 12/27/2018 0241   RDW 15.0 12/27/2018 0241   LYMPHSABS 1.0 12/18/2018 0916   MONOABS 0.8 12/18/2018 0916   EOSABS 0.2 12/18/2018 0916   BASOSABS 0.1 12/18/2018 0916   CMP Latest Ref Rng & Units 12/27/2018 12/26/2018 12/24/2018  Glucose 70  - 99 mg/dL 98 536(U110(H) 89  BUN 6 - 20 mg/dL <4(Q<5(L) <0(H<5(L) 5(L)  Creatinine 0.61 - 1.24 mg/dL 4.740.64 2.590.65 5.63(O0.53(L)  Sodium 135 - 145 mmol/L 137 135 137  Potassium 3.5 - 5.1 mmol/L 3.7 3.3(L) 3.6  Chloride 98 - 111 mmol/L 101 99 104  CO2 22 - 32 mmol/L 28 26 25   Calcium 8.9 - 10.3 mg/dL 7.5(I8.5(L) 8.3(L) 8.4(L)  Total Protein 6.5 - 8.1 g/dL 6.8 6.3(L) 6.6  Total Bilirubin 0.3 - 1.2 mg/dL 3.1(H) 2.9(H) 3.5(H)  Alkaline Phos 38 - 126 U/L 138(H) 119 128(H)  AST 15 - 41 U/L 58(H) 54(H) 60(H)  ALT 0 - 44 U/L 25 25 28    CT Head: IMPRESSION: Study within normal limits.  RUQ Ultrasound: IMPRESSION:  1. Cirrhotic appearance of the liver. There is no discreet liver injury. 2. sludge from the gallbladder with traces of pericolecystic fluid and thickening of the gallbladder wall, probably secondary to chronic liver disease.  Discharge instructions: Thank you for letting us take care of you during your hospital stay.  It is imperative to schedule a follow-up appointment with a primary care physician as soon as possible after discharge. It is also essential to do everything possible not to drink alcohol. It can further damage your liver or cause more seizures.  The following medications are prescribed for you. They have been printed and you can take them to any pharmacy for filling: -Trazodone 50 mg  oral tablet, take 1 tablet orally at bedtime -Lactulose 30 ml (20 g total) orally 3 times a day -Gabapentin 300 mg orally 3 times a day -Folic acid 1 mg tablet take 1 dose daily   Signed: Kirt BoysAndrew Felita Bump, MD Internal Medicine, PGY1 Pager: [phone]  12/29/2018,4:07 PM     Internal Medicine Attending Note:  I saw and examined the patient on the day of discharge. I reviewed and agree with the discharge summary written by the house staff.  Jessy OtoAlexander Acelynn Dejonge, M.D., Ph.D.

## 2018-12-27 NOTE — Progress Notes (Addendum)
Subjective: Spanish interpretor used by phone. Pt did not call his brother yesterday. He is okay with doctor calling him today to arrange travel on discharge. He endorses continued improvement. Denies seeing people or things that others do not see since he has been at the hospital. Cooperative and alert during exam. Patient is not oriented to place, time, he is alert to self .   Of note, talked to pt's brother, Cassandria Santee today. Says a friend named Rica Mote can pick him up Sunday afternoon.   Objective:  Vital signs in last 24 hours: Vitals:   12/26/18 0520 12/26/18 1417 12/26/18 1917 12/27/18 0606  BP: 105/72 108/76 110/81 109/74  Pulse: 84 87 85 84  Resp: 13 18 19 17   Temp: 98.3 F (36.8 C) 98.7 F (37.1 C) 98.2 F (36.8 C) 99.7 F (37.6 C)  TempSrc: Oral Oral Oral Oral  SpO2: 99% 99% 98% 96%  Weight:      Height:       Physical Exam Constitutional:      General: He is not in acute distress.    Appearance: Normal appearance. He is not toxic-appearing.  HENT:     Head: Normocephalic and atraumatic.  Eyes:     General: Scleral icterus present.        Right eye: No discharge.        Left eye: No discharge.     Extraocular Movements: Extraocular movements intact.  Cardiovascular:     Rate and Rhythm: Normal rate and regular rhythm.     Pulses: Normal pulses.     Heart sounds: Normal heart sounds. No murmur. No friction rub. No gallop.   Pulmonary:     Effort: No respiratory distress.     Breath sounds: No wheezing or rales.  Abdominal:     General: Abdomen is flat. There is no distension.     Palpations: Abdomen is soft.     Tenderness: There is no abdominal tenderness. There is no guarding.  Musculoskeletal:     Right lower leg: No edema.     Left lower leg: No edema.  Skin:    General: Skin is warm.     Coloration: Skin is jaundiced.     Findings: No rash.  Neurological:     Mental Status: He is alert.     Comments: Oriented to self, and birthday. He thinks we are in  [place] and the year is 2001    Assessment/Plan:  Principal Problem:   Alcohol use disorder, severe, dependence ([place]) Active Problems:   Seizure ([place])   Alcohol withdrawal syndrome, with delirium ([place])   Encephalopathy   Alcoholic cirrhosis ([place])   Thrombocytopenia ([place])  In summary, Mr. Hunley is a 38 y.o male with a history of alcohol use disorder admitted on 7/1 for alcohol withdrawal seizures.   Of note, (7/6) I had a 30+ minute discussion with the patient's brother, [place]. In summary Mr. Dials has been having major memory lapses for the last 2-3 months. He frequently repeats himself because he forgets what he says. Questionable psychiatric hx, as Cassandria Santee says his brother hasn't slept in 2 weeks prior to hospitalization and has episodes where he is extremely talkative and doesn't make sense. Has been drinking heavily and is in denial about his alcoholism. Brother says he also has been seeing things that aren't there and hearing voices for the last 2-3 months. He says this was not the case before, and seem to have coincided with increased EtOH consumption.   #  Transaminitis #Encephalopathy #Alcohol Withdrawal Seizures: Patient reports drinking at least 12 beers per day PTA. LFTs on presentation concerning for cirrhosis vs alcoholic hepatitis, and finding of cirrhosis on ultrasound. His last drink was roughly 2 days before admission. Patient had a seizure prior to admission, but has not had any further seizures since being at the hospital. He is not in withdrawal any longer.   Patient remains severely confused after resolution of withdrawal, concerning for Wernicke's Encephalopathy, Korsakoff syndrome, or hepatic encephalopathy. Doesn't know where he is or the year despite us telling him every day. Patient was IVC'd due to risk of harm to himself. We are treating empirically with lactulose for possible hepatic encephalopathy. No asterixis on exam.  - Vitamin B1 level WNL   - Increased  thiamine dose to 500mg  TID for 2 days (done) followed by 250mg  daily for 5 days (3/5) - Finished 4 day Librium taper  - Continue Folic acid - Lactulose 20mg  TID, titrate to 3 BM a day.  - RPR & HIV negative. B12 normal.  - MELD score 16, indicating 6% mortality in next 3 months, if able to stay sober, will probably need hepatologist evaluation for consideration of transplanted liver.    #Thrombocytopenia: Likely due to excessive alcohol consumption. Platelet count 29 on admission. Platelet count continues to improve (238) - every other day CBC   #Possible bipolar disorder vs schizophrenia vs. substance induced psychosis - I was able to talk to the patient's brother and noted the patient was up for 15 days straight without sleeping. He also has episodes of talking very fast and not making sense. Today, the brother says he noticed Mr.Peachey has been seeing things that aren't there and hearing voices for the last 2-3 months. He says this was not the case before, and seem to have coincided with increased EtOH consumption.  The patient says he has not heard voices or seen things that are not really there since he has been at the hospital. - Psych consulted, recs are as follows:             - Gabapentin 300 TID for alcohol cravings             - Trazadone 50mg  qhs PRN for sleep issues             - No antipsychotics or mood stabilizers recommended at this time.    #FEN/GI #Hyponatremia: Resolved #Hypokalemia: Replete as needed #Hypomangnesemia: Will continue repletion   Dispo: Anticipated discharge this weekend.  - Brother says he is working on finding someone pick Mr. Currie up.  Friend named Jari FavreOscar will pick him up Sunday afternoon. - CM looking into alcohol treatment facilities in [place] -  [employer] (Brother) phone: [phone]

## 2018-12-27 NOTE — Progress Notes (Addendum)
NCM spoke with pt @ bedside via [employer] interpreter regarding d/c plan. Pt speaks spanish, little english. Pt states he will d/c over the weekend, ? Sat./Sunday. Brother to supply him with bus ticket to travel home to [place]. States travel can be 2-3 days. Once he arrives to [place] patient states he will live with parents who is supportive and will help care for him. States he has a PCP in [place] and will f/u with PCP within a week. Pt states has little money. Pt will probably need [employer] to assist with Rx meds..... NCM discussed case with Camera operator. CN to f/u with NCM over weekend. Whitman Hero RN,BSN,CM

## 2018-12-28 LAB — GLUCOSE, CAPILLARY: Glucose-Capillary: 158 mg/dL — ABNORMAL HIGH (ref 70–99)

## 2018-12-28 LAB — MAGNESIUM
Magnesium: 1.6 mg/dL — ABNORMAL LOW (ref 1.7–2.4)
Magnesium: 1.8 mg/dL (ref 1.7–2.4)

## 2018-12-28 MED ORDER — MAGNESIUM SULFATE 2 GM/50ML IV SOLN
2.0000 g | Freq: Once | INTRAVENOUS | Status: AC
  Administered 2018-12-28: 2 g via INTRAVENOUS
  Filled 2018-12-28: qty 50

## 2018-12-28 NOTE — Progress Notes (Signed)
Subjective: I saw the patient on morning rounds today.  Sitting up at the bedside ready to eat breakfast.  He appears comfortable.  He has no complaints.  He did not talk to his brother yesterday.  He says he tried to get his phone but he was not allowed.  I told the patient I will talk to his nurse about allowing him to call his brother.  He is amendable to being discharged tomorrow.  Reinforced how important it is for him not to drink alcohol once he is discharged.  The patient notes that he will be living with his parents in [place] and will not allow him to have any alcohol.  Objective:  Vital signs in last 24 hours: Vitals:   12/26/18 1917 12/27/18 0606 12/27/18 2206 12/28/18 0558  BP: 110/81 109/74 110/74 95/69  Pulse: 85 84 79 81  Resp: 19 17 16 16   Temp: 98.2 F (36.8 C) 99.7 F (37.6 C) 97.8 F (36.6 C) 98.2 F (36.8 C)  TempSrc: Oral Oral Oral Oral  SpO2: 98% 96% 99% 98%  Weight:      Height:       Physical Exam Constitutional:      General: He is not in acute distress.    Appearance: Normal appearance. He is normal weight. He is not ill-appearing, toxic-appearing or diaphoretic.  HENT:     Mouth/Throat:     Mouth: Mucous membranes are moist.  Eyes:     General: Scleral icterus present.        Right eye: No discharge.        Left eye: No discharge.     Extraocular Movements: Extraocular movements intact.  Cardiovascular:     Rate and Rhythm: Normal rate and regular rhythm.     Pulses: Normal pulses.     Heart sounds: No murmur. No friction rub. No gallop.   Pulmonary:     Effort: Pulmonary effort is normal. No respiratory distress.     Breath sounds: Normal breath sounds. No wheezing or rales.  Abdominal:     General: Abdomen is flat. Bowel sounds are normal. There is no distension.     Palpations: Abdomen is soft.     Tenderness: There is no abdominal tenderness. There is no guarding.  Musculoskeletal:        General: No swelling or deformity.     Right  lower leg: No edema.     Left lower leg: No edema.  Skin:    General: Skin is warm and dry.     Findings: No rash.  Neurological:     Mental Status: He is alert.     Sensory: No sensory deficit.     Coordination: Coordination normal.     Comments: Alert.  Says year is 2002, but knows he is in [place].  Says he is 38 years old.   Psychiatric:        Mood and Affect: Mood normal.     Comments: Smiling, appears to be in a good mood    Assessment/Plan:  Principal Problem:   Alcohol use disorder, severe, dependence ([facility]) Active Problems:   Seizure ([facility])   Alcohol withdrawal syndrome, with delirium ([place])   Encephalopathy   Alcoholic cirrhosis ([facility])   Thrombocytopenia ([place])    Thrombocytopenia ([place])  In summary,James Huff is a 38 y.o male with a history of alcoholuse disorder admittedon 7/4for alcohol withdrawal seizures.  Of note, (7/6)I had a 30+ minute discussion with the patient's brother, Cassandria Santee.  In summary Mr. Shuler has been having major memory lapses for the last 2-3 months. He frequently repeats himself because he forgets what he says. Questionable psychiatric hx, as Jonetta SpeakLuis says his brother hasn't slept in 2 weeks prior to hospitalization and has episodes where he is extremely talkative and doesn't make sense. Has been drinking heavily and is in denial about his alcoholism.Brother says he also has been seeing things that aren't there and hearing voices for the last 2-3 months. He says this was not the case before, and seem to have coincided with increased EtOH consumption.  #Transaminitis #Encephalopathy #Alcohol Withdrawal Seizures:Patient reports drinking at least 12 beers per day PTA. LFTson presentation concerning for cirrhosis vs alcoholic hepatitis, and finding of cirrhosis on ultrasound. Hislast drink was roughly 2 daysbefore admission. Patient had a seizure prior to admission, but has not had any further seizures since being at the hospital. He is not in  withdrawal any longer.  Patient remains confused afterresolution ofwithdrawal,concerning for Wernicke's Encephalopathy, Korsakoff syndrome, or hepatic encephalopathy. For the first time during this admission he correctly states that [place].  However he still does not know the year and says he is only 38 (he's 38). Patient was initially IVC'd due to risk of harm to himself.We are treating empirically with lactulose for possible hepatic encephalopathy. No asterixis on exam.  -Vitamin B1 levelWNL  -Increased thiamine dose to 500mg  TID for 2 days(done)followed by 250mg  daily for 5 days (stop date 7/13)   -Will transition to oral upon discharge -Finished 4 dayLibrium taper  - Continue Folic acid -Lactulose 20mg  TID, titrate to 3 BM a day.  - RPR&HIV negative.B12 normal.  - MELD score 16, indicating 6% mortality in next 3 months, if able to stay sober, willprobablyneed hepatologist evaluation for consideration of transplanted liver.  #Thrombocytopenia:Likely due to excessive alcohol consumption.Platelet count 29 on admission.Platelet count continues toimprove(238) -every other dayCBC  #Possiblebipolar disordervs schizophrenia vs.substance induced psychosis-I was able to talk to thepatient's brother and noted the patient was up for 15 days straight without sleeping. He also has episodes of talking very fast and not making sense.Today, the brother says he noticed JamesSommers has been seeing things that aren't there and hearing voices for the last 2-3 months. He says this was not the case before, and seem to have coincided with increased EtOH consumption. The patient says he has not heard voices or seen things that are not really there since he has been at the hospital. -Psych consulted, recs are as follows: - Gabapentin 300 TID for alcohol cravings - Trazadone 50mg  qhs PRN for sleep issues - No antipsychoticsor  mood stabilizersrecommendedat this time.    #FEN/GI #Hyponatremia: Resolved #Hypokalemia:Replete as needed #Hypomangnesemia:Will continue repletion as needed.  Rechecking level this evening.  Will likely prescribe oral magnesium upon discharge.  Dispo: Anticipated dischargetomorrow. -Brother says he is working on finding someone pick Mr. Igoe up.  Friend named Jari FavreOscar will pick him up Sunday afternoon. - CM looking into alcohol treatment facilities in [place] - [employer] (Brother) phone: [phone]  Kirt BoysAndrew Jobin Montelongo, MD Internal Medicine, PGY1 Pager: [phone]  12/28/2018,8:49 AM

## 2018-12-29 DIAGNOSIS — K7010 Alcoholic hepatitis without ascites: Secondary | ICD-10-CM

## 2018-12-29 DIAGNOSIS — F10259 Alcohol dependence with alcohol-induced psychotic disorder, unspecified: Secondary | ICD-10-CM

## 2018-12-29 DIAGNOSIS — F209 Schizophrenia, unspecified: Secondary | ICD-10-CM

## 2018-12-29 DIAGNOSIS — F319 Bipolar disorder, unspecified: Secondary | ICD-10-CM

## 2018-12-29 LAB — GLUCOSE, CAPILLARY: Glucose-Capillary: 79 mg/dL (ref 70–99)

## 2018-12-29 MED ORDER — TRAZODONE HCL 50 MG PO TABS: 50.0000 mg | ORAL_TABLET | Freq: Every day | ORAL | 0 refills | Status: AC

## 2018-12-29 MED ORDER — GABAPENTIN 600 MG PO TABS: 300.0000 mg | ORAL_TABLET | Freq: Three times a day (TID) | ORAL | 0 refills | Status: AC

## 2018-12-29 MED ORDER — LACTULOSE 10 GM/15ML PO SOLN: 20.0000 g | Freq: Three times a day (TID) | ORAL | 0 refills | Status: AC

## 2018-12-29 MED ORDER — FOLIC ACID 1 MG PO TABS: 1.0000 mg | ORAL_TABLET | Freq: Every day | ORAL | 0 refills | Status: AC

## 2018-12-29 NOTE — Progress Notes (Addendum)
Returned pts. fully charged phone to him at 1110 on 12/29/18.

## 2018-12-29 NOTE — Care Management (Signed)
Provided patient with [facility] letter for prescriptions

## 2018-12-29 NOTE — Progress Notes (Signed)
James Huff to be D/C'd home per MD order. Discussed with the patient and all questions fully answered. Used Optometrist at bedside Product/process development scientist)  VVS, Skin clean, dry and intact without evidence of skin break down, no evidence of skin tears noted.  IV catheter discontinued intact. Site without signs and symptoms of complications. Dressing and pressure applied.  An After Visit Summary was printed and given to the patient.  Patient escorted via [facility], and D/C home via private auto.  James Huff  12/29/2018 4:55 PM

## 2018-12-29 NOTE — Progress Notes (Signed)
Subjective: I saw the patient on morning rounds today.  Sitting up at the bedside ready to eat breakfast.  He appears comfortable.  He has no complaints.  He did not talk to his brother yesterday.  He says he tried to get his phone but he was not allowed.  I told the patient I will talk to his nurse about allowing him to call his brother.  He is amendable to being discharged tomorrow.  Reinforced how important it is for him not to drink alcohol once he is discharged.  The patient notes that he will be living with his parents in [place] and will not allow him to have any alcohol.  Objective:  Vital signs in last 24 hours: Vitals:   12/28/18 0558 12/28/18 1412 12/28/18 2142 12/29/18 0515  BP: 95/69 113/75 105/71 112/73  Pulse: 81 97 83 84  Resp: 16 18 18 18   Temp: 98.2 F (36.8 C) 98.3 F (36.8 C) 98.2 F (36.8 C) 97.8 F (36.6 C)  TempSrc: Oral Oral Oral Oral  SpO2: 98% 99% 96% 98%  Weight:      Height:       Physical Exam Constitutional:      General: He is not in acute distress.    Appearance: Normal appearance. He is normal weight. He is not ill-appearing, toxic-appearing or diaphoretic.  HENT:     Mouth/Throat:     Mouth: Mucous membranes are moist.  Eyes:     General: Scleral icterus present.        Right eye: No discharge.        Left eye: No discharge.     Extraocular Movements: Extraocular movements intact.  Cardiovascular:     Rate and Rhythm: Normal rate and regular rhythm.     Pulses: Normal pulses.     Heart sounds: No murmur. No friction rub. No gallop.   Pulmonary:     Effort: Pulmonary effort is normal. No respiratory distress.     Breath sounds: Normal breath sounds. No wheezing or rales.  Abdominal:     General: Abdomen is flat. Bowel sounds are normal. There is no distension.     Palpations: Abdomen is soft.     Tenderness: There is no abdominal tenderness. There is no guarding.  Musculoskeletal:        General: No swelling or deformity.     Right  lower leg: No edema.     Left lower leg: No edema.  Skin:    General: Skin is warm and dry.     Findings: No rash.  Neurological:     Mental Status: He is alert.     Sensory: No sensory deficit.     Coordination: Coordination normal.     Comments: Alert.  Says year is 2002, but knows [place] is in [place].  Says he is 38 years old.   Psychiatric:        Mood and Affect: Mood normal.     Comments: Smiling, appears to be in a good mood    Assessment/Plan:  Principal Problem:   Alcohol use disorder, severe, dependence ([facility]) Active Problems:   Seizure ([facility])   Alcohol withdrawal syndrome, with delirium ([facility])   Encephalopathy   Alcoholic cirrhosis ([facility])   Thrombocytopenia ([facility])    Thrombocytopenia ([facility])  In summary,Mr. Marcell BarlowCuellar is a 38 y.o male with a history of alcoholuse disorder admittedon 7/371for alcohol withdrawal seizures.  Of note, (7/6)I had a 30+ minute discussion with the patient's brother, Jonetta SpeakLuis.  In summary Mr. Ralston has been having major memory lapses for the last 2-3 months. He frequently repeats himself because he forgets what he says. Questionable psychiatric hx, as Cassandria Santee says his brother hasn't slept in 2 weeks prior to hospitalization and has episodes where he is extremely talkative and doesn't make sense. Has been drinking heavily and is in denial about his alcoholism.Brother says he also has been seeing things that aren't there and hearing voices for the last 2-3 months. He says this was not the case before, and seem to have coincided with increased EtOH consumption.  #Transaminitis #Encephalopathy #Alcohol Withdrawal Seizures:Patient reports drinking at least 12 beers per day PTA. LFTson presentation concerning for cirrhosis vs alcoholic hepatitis, and finding of cirrhosis on ultrasound. Hislast drink was roughly 2 daysbefore admission. Patient had a seizure prior to admission, but has not had any further seizures since being at the hospital. He is not in  withdrawal any longer.  Patient remains confused afterresolution ofwithdrawal,concerning for Wernicke's Encephalopathy, Korsakoff syndrome, or hepatic encephalopathy. For the first time during this admission he correctly states that he is in [place].  However he still does not know the year and says he is only 36 (he's 76). Patient was initially IVC'd due to risk of harm to himself.We are treating empirically with lactulose for possible hepatic encephalopathy. No asterixis on exam.  -Vitamin B1 levelWNL  -Increased thiamine dose to 500mg  TID for 2 days(done)followed by 250mg  daily for 5 days (stop date 7/13)   -Will transition to oral upon discharge -Finished 4 dayLibrium taper  - Continue Folic acid -Lactulose 20mg  TID, titrate to 3 BM a day.  - RPR&HIV negative.B12 normal.  - MELD score 16, indicating 6% mortality in next 3 months, if able to stay sober, willprobablyneed hepatologist evaluation for consideration of transplanted liver.  #Thrombocytopenia:Likely due to excessive alcohol consumption.Platelet count 29 on admission.Platelet count continues toimprove(238) -every other dayCBC  #Possiblebipolar disordervs schizophrenia vs.substance induced psychosis-I was able to talk to thepatient's brother and noted the patient was up for 15 days straight without sleeping. He also has episodes of talking very fast and not making sense.Today, the brother says he noticed Mr.Henion has been seeing things that aren't there and hearing voices for the last 2-3 months. He says this was not the case before, and seem to have coincided with increased EtOH consumption. The patient says he has not heard voices or seen things that are not really there since he has been at the hospital. -Psych consulted, recs are as follows: - Gabapentin 300 TID for alcohol cravings - Trazadone 50mg  qhs PRN for sleep issues - No antipsychoticsor  mood stabilizersrecommendedat this time.    #FEN/GI #Hyponatremia: Resolved #Hypokalemia:Replete as needed #Hypomangnesemia: Replete as needed  Dispo: Anticipated dischargetoday -Brother says he is working on finding someone pick Mr. Anes up this evening. Cassandria Santee (Brother) phone: [phone]  Earlene Plater, MD Internal Medicine, PGY1 Pager: [phone]  12/29/2018,4:15 PM

## 2020-05-31 IMAGING — CT CT HEAD WITHOUT CONTRAST
4 series · 14 of 47 positions shown, 16 images · non-contrast
Comparison: None.

CLINICAL DATA: Seizures

EXAM:
CT HEAD WITHOUT CONTRAST
TECHNIQUE: Contiguous axial images were obtained from the base of the skull
through the vertex without intravenous contrast.

[Series 3: head without · axial · non-contrast · 0.42mm/px · z∈[-102,+24]mm · 6 of 36 slices shown, 8 images]
[im 6/36  brain]
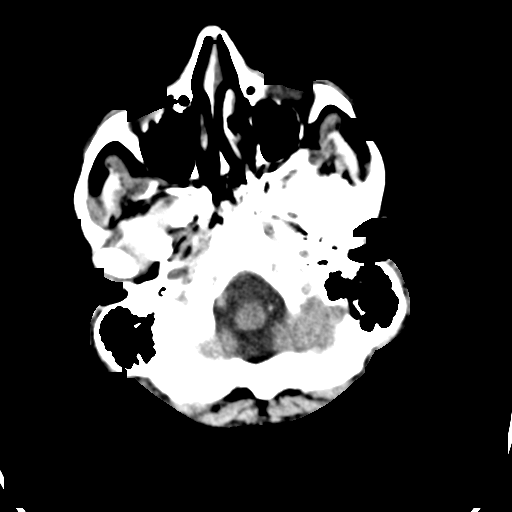
[im 6/36  bone]
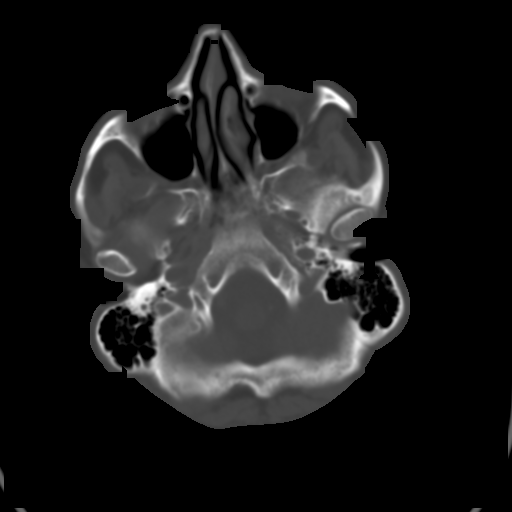
[im 11/36  brain]
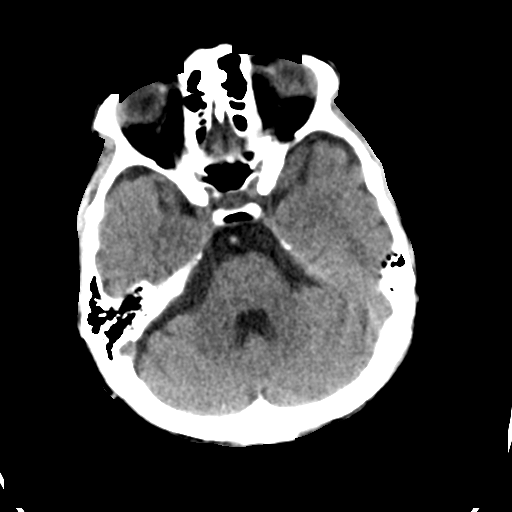
[im 16/36  brain]
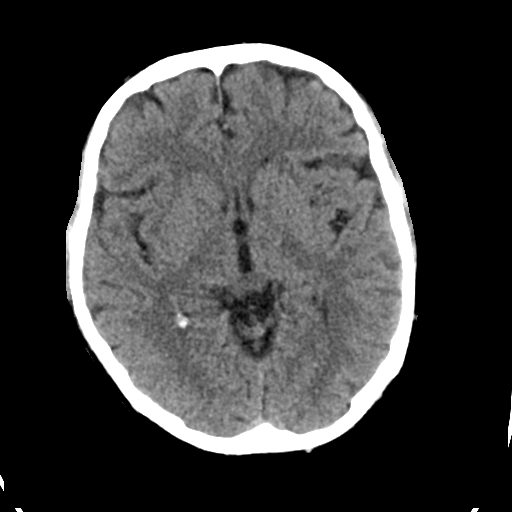
[im 21/36  brain]
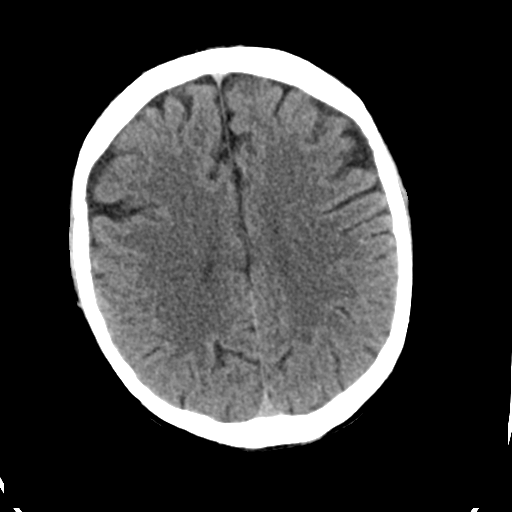
[im 26/36  brain]
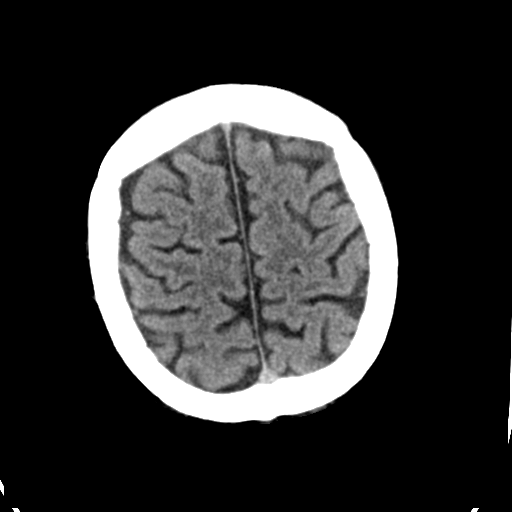
[im 26/36  bone]
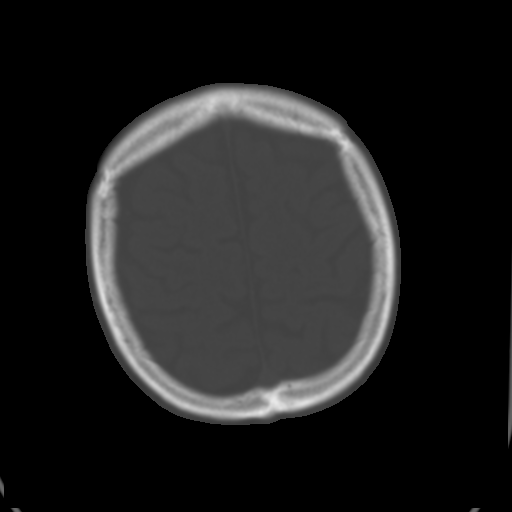
[im 31/36  brain]
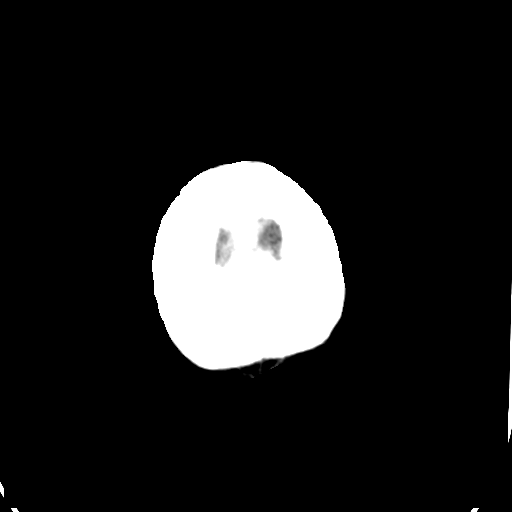

[Series 4: head bone · axial · 0.42mm/px · z∈[-110,-92]mm · 2 of 92 slices shown]
[im 9/92  bone]
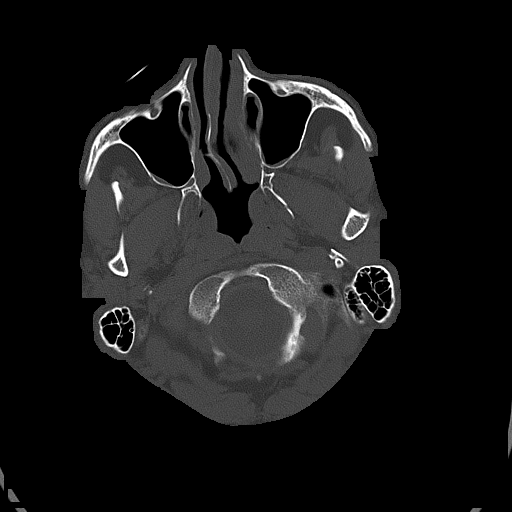
[im 18/92  bone]
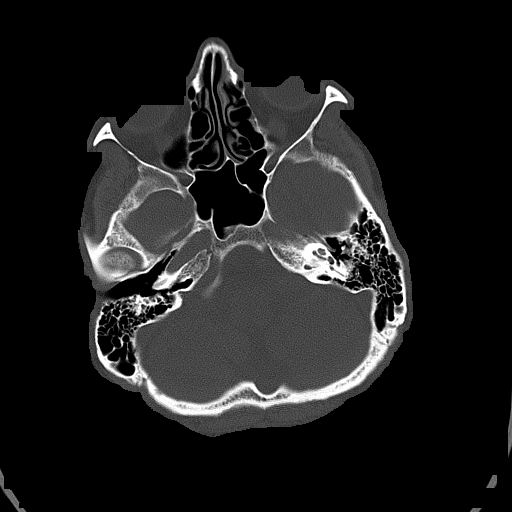

[Series 5: head without cor · coronal · non-contrast · 0.36mm/px · 3 of 67 slices shown]
[im 23/67  brain]
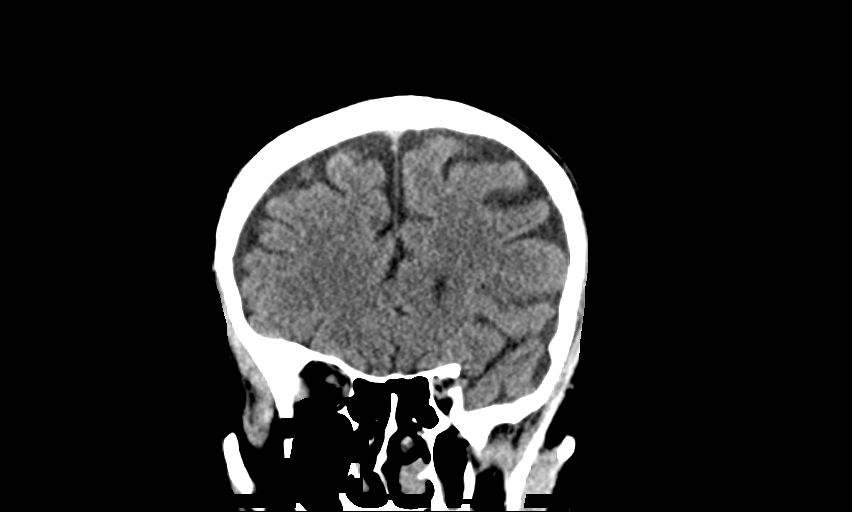
[im 30/67  brain]
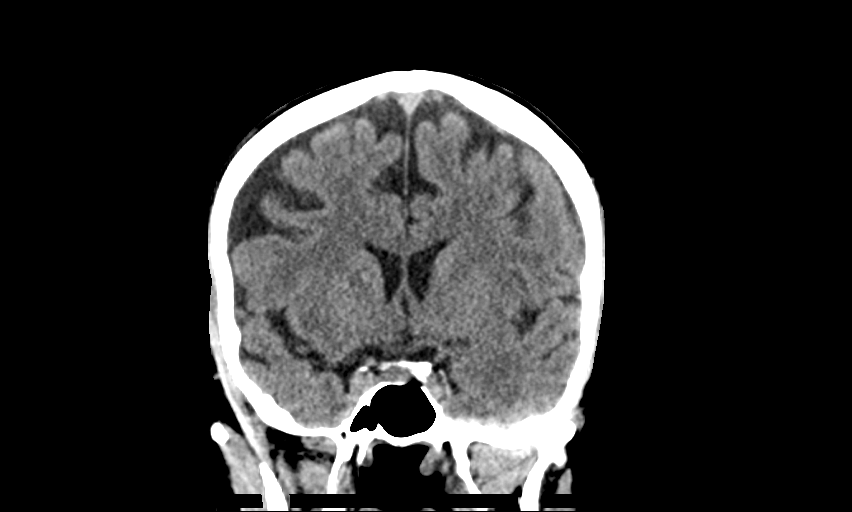
[im 37/67  brain]
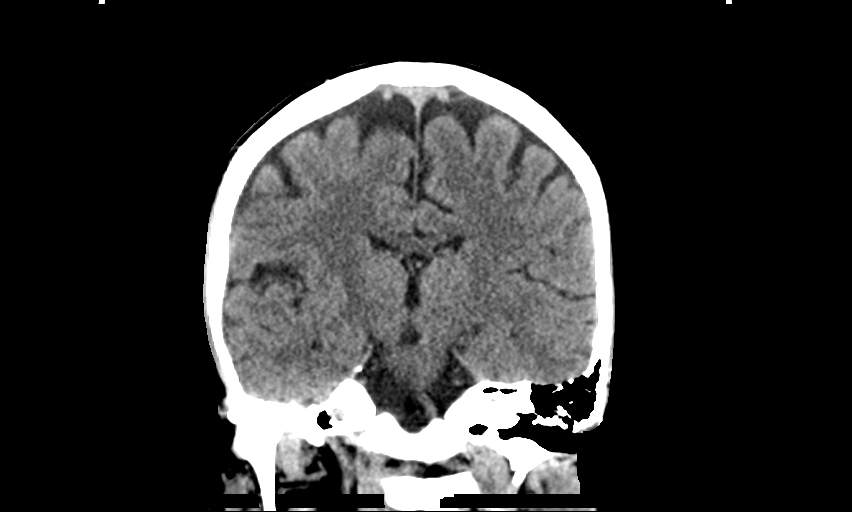

[Series 6: head without sag · sagittal · non-contrast · 0.36mm/px · 3 of 67 slices shown]
[im 23/67  brain]
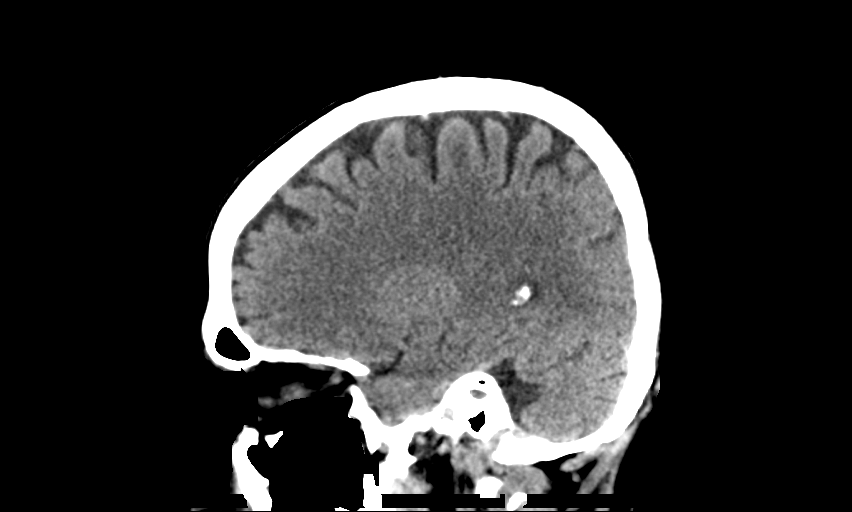
[im 34/67  brain]
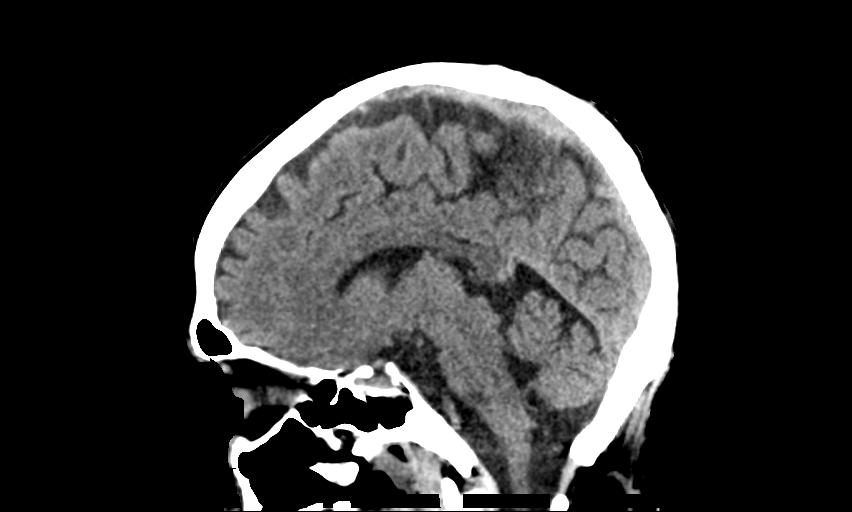
[im 45/67  brain]
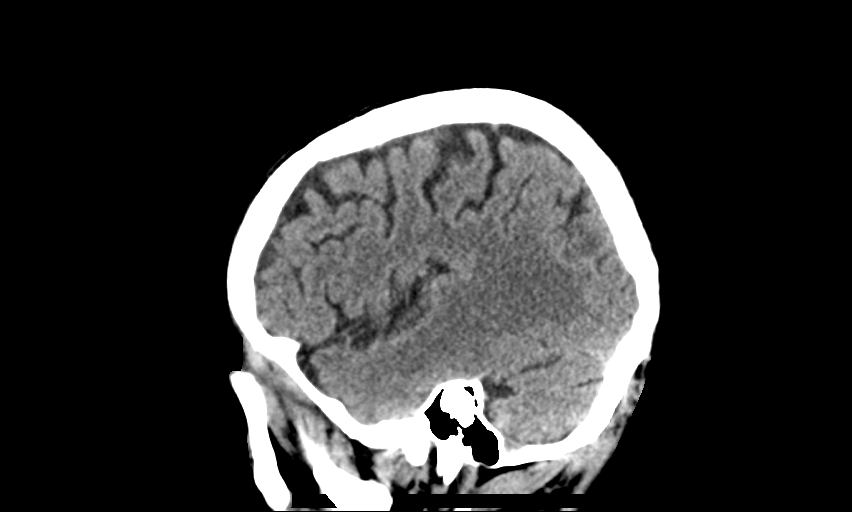

[14 of 47 positions shown; findings below may reference images not displayed]

FINDINGS: Brain: The ventricles are normal in size and configuration. There is
no intracranial mass, hemorrhage, extra-axial fluid collection, or
midline shift. The brain parenchyma appears unremarkable. No acute
infarct is demonstrable.

Vascular: There is no hyperdense vessel. There is no appreciable
vascular calcification.

Skull: Bony calvarium appears intact.

Sinuses/Orbits: Visualized paranasal sinuses are clear. Orbits
appear symmetric bilaterally.

Other: Mastoid air cells are clear.
IMPRESSION: Study within normal limits.
